# Patient Record
Sex: Female | Born: 1935 | Race: White | Hispanic: No | Marital: Married
Health system: Midwestern US, Academic
[De-identification: ages and names within clinical notes are randomized; demographics above are authoritative.]

---

## 2017-01-20 LAB — COMPREHENSIVE METABOLIC PANEL
Lab: 0.7
Lab: 13
Lab: 25
Lab: 25
Lab: 4
Lab: 6.9
Lab: 80

## 2017-01-20 LAB — LIPID PROFILE
Lab: 17 — ABNORMAL HIGH (ref 0.57–1.11)
Lab: 3
Lab: 92

## 2017-01-20 LAB — THYROID STIMULATING HORMONE-TSH: Lab: 8.1 — ABNORMAL HIGH (ref 0.35–4.94)

## 2017-01-20 LAB — FREE T4 (FREE THYROXINE) ONLY: Lab: 1

## 2017-03-15 ENCOUNTER — Encounter: Admit: 2017-03-15 | Discharge: 2017-03-15 | Payer: MEDICARE

## 2017-03-29 ENCOUNTER — Ambulatory Visit: Admit: 2017-03-29 | Discharge: 2017-03-30 | Payer: MEDICARE

## 2017-03-29 ENCOUNTER — Encounter: Admit: 2017-03-29 | Discharge: 2017-03-29 | Payer: MEDICARE

## 2017-03-29 DIAGNOSIS — E039 Hypothyroidism, unspecified: ICD-10-CM

## 2017-03-29 DIAGNOSIS — N183 Chronic kidney disease, stage 3 (moderate): ICD-10-CM

## 2017-03-29 DIAGNOSIS — I25110 Atherosclerotic heart disease of native coronary artery with unstable angina pectoris: ICD-10-CM

## 2017-03-29 DIAGNOSIS — N289 Disorder of kidney and ureter, unspecified: ICD-10-CM

## 2017-03-29 DIAGNOSIS — Z955 Presence of coronary angioplasty implant and graft: ICD-10-CM

## 2017-03-29 DIAGNOSIS — J449 Chronic obstructive pulmonary disease, unspecified: ICD-10-CM

## 2017-03-29 DIAGNOSIS — E785 Hyperlipidemia, unspecified: ICD-10-CM

## 2017-03-29 DIAGNOSIS — E7800 Pure hypercholesterolemia, unspecified: Principal | ICD-10-CM

## 2017-05-19 ENCOUNTER — Encounter: Admit: 2017-05-19 | Discharge: 2017-05-19 | Payer: MEDICARE

## 2017-05-19 DIAGNOSIS — E039 Hypothyroidism, unspecified: ICD-10-CM

## 2017-05-19 DIAGNOSIS — N183 Chronic kidney disease, stage 3 (moderate): ICD-10-CM

## 2017-05-19 DIAGNOSIS — N289 Disorder of kidney and ureter, unspecified: ICD-10-CM

## 2017-05-19 DIAGNOSIS — E785 Hyperlipidemia, unspecified: Principal | ICD-10-CM

## 2017-05-19 DIAGNOSIS — J449 Chronic obstructive pulmonary disease, unspecified: ICD-10-CM

## 2017-09-07 ENCOUNTER — Encounter: Admit: 2017-09-07 | Discharge: 2017-09-08 | Payer: MEDICARE

## 2017-09-12 ENCOUNTER — Encounter: Admit: 2017-09-12 | Discharge: 2017-09-13 | Payer: MEDICARE

## 2017-09-13 ENCOUNTER — Encounter: Admit: 2017-09-13 | Discharge: 2017-09-13 | Payer: MEDICARE

## 2017-09-13 ENCOUNTER — Ambulatory Visit: Admit: 2017-09-13 | Discharge: 2017-09-14 | Payer: MEDICARE

## 2017-09-13 DIAGNOSIS — R509 Fever, unspecified: Principal | ICD-10-CM

## 2017-09-14 ENCOUNTER — Encounter: Admit: 2017-09-14 | Discharge: 2017-09-15 | Payer: MEDICARE

## 2017-09-14 ENCOUNTER — Encounter: Admit: 2017-09-14 | Discharge: 2017-09-14 | Payer: MEDICARE

## 2017-09-15 ENCOUNTER — Encounter: Admit: 2017-09-15 | Discharge: 2017-09-15 | Payer: MEDICARE

## 2017-09-15 DIAGNOSIS — T847XXD Infection and inflammatory reaction due to other internal orthopedic prosthetic devices, implants and grafts, subsequent encounter: ICD-10-CM

## 2017-09-15 MED ORDER — VANCOMYCIN RANDOM DOSING
1 | INTRAVENOUS | 0 refills | Status: DC
Start: 2017-09-15 — End: 2017-09-17

## 2017-09-15 MED ORDER — LOSARTAN 50 MG PO TAB
50 mg | Freq: Every day | ORAL | 0 refills | Status: DC
Start: 2017-09-15 — End: 2017-09-16
  Administered 2017-09-16: 13:00:00 50 mg via ORAL

## 2017-09-15 MED ORDER — CEFTRIAXONE INJ 2GM IVP
2 g | INTRAVENOUS | 0 refills | Status: DC
Start: 2017-09-15 — End: 2017-09-21
  Administered 2017-09-16 – 2017-09-21 (×7): 2 g via INTRAVENOUS

## 2017-09-15 MED ORDER — VANCOMYCIN PHARMACY TO MANAGE
1 | 0 refills | Status: DC
Start: 2017-09-15 — End: 2017-09-20

## 2017-09-15 MED ORDER — MELATONIN 3 MG PO TAB
3 mg | Freq: Every evening | ORAL | 0 refills | Status: DC | PRN
Start: 2017-09-15 — End: 2017-09-21

## 2017-09-15 MED ORDER — ACETAMINOPHEN 325 MG PO TAB
650 mg | ORAL | 0 refills | Status: DC | PRN
Start: 2017-09-15 — End: 2017-09-21

## 2017-09-15 MED ORDER — NITROGLYCERIN 0.4 MG SL SUBL
.4 mg | SUBLINGUAL | 0 refills | Status: DC | PRN
Start: 2017-09-15 — End: 2017-09-21

## 2017-09-15 MED ORDER — CETIRIZINE 10 MG PO TAB
10 mg | Freq: Every day | ORAL | 0 refills | Status: DC | PRN
Start: 2017-09-15 — End: 2017-09-21

## 2017-09-15 MED ORDER — LEVOTHYROXINE 88 MCG PO TAB
88 ug | Freq: Every day | ORAL | 0 refills | Status: DC
Start: 2017-09-15 — End: 2017-09-21
  Administered 2017-09-16 – 2017-09-21 (×6): 88 ug via ORAL

## 2017-09-15 MED ORDER — CEFTRIAXONE INJ 1GM IVP
1 g | INTRAVENOUS | 0 refills | Status: DC
Start: 2017-09-15 — End: 2017-09-16

## 2017-09-15 MED ORDER — ONDANSETRON HCL 4 MG PO TAB
4 mg | ORAL | 0 refills | Status: DC | PRN
Start: 2017-09-15 — End: 2017-09-21

## 2017-09-15 MED ORDER — POLYETHYLENE GLYCOL 3350 17 GRAM PO PWPK
1 | Freq: Every day | ORAL | 0 refills | Status: DC | PRN
Start: 2017-09-15 — End: 2017-09-21

## 2017-09-15 MED ORDER — DOXYCYCLINE HYCLATE 100 MG PO TAB
100 mg | Freq: Two times a day (BID) | ORAL | 0 refills | Status: DC
Start: 2017-09-15 — End: 2017-09-21
  Administered 2017-09-16 – 2017-09-21 (×12): 100 mg via ORAL

## 2017-09-15 MED ORDER — ATORVASTATIN 40 MG PO TAB
80 mg | Freq: Every day | ORAL | 0 refills | Status: DC
Start: 2017-09-15 — End: 2017-09-21
  Administered 2017-09-16 – 2017-09-21 (×6): 80 mg via ORAL

## 2017-09-15 MED ORDER — PANTOPRAZOLE 20 MG PO TBEC
20 mg | Freq: Every day | ORAL | 0 refills | Status: DC
Start: 2017-09-15 — End: 2017-09-21
  Administered 2017-09-16 – 2017-09-21 (×7): 20 mg via ORAL

## 2017-09-15 MED ORDER — ASPIRIN 81 MG PO TBEC
81 mg | Freq: Every day | ORAL | 0 refills | Status: DC
Start: 2017-09-15 — End: 2017-09-21
  Administered 2017-09-16 – 2017-09-21 (×7): 81 mg via ORAL

## 2017-09-15 MED ORDER — ALBUTEROL SULFATE 90 MCG/ACTUATION IN HFAA
2 | RESPIRATORY_TRACT | 0 refills | Status: DC | PRN
Start: 2017-09-15 — End: 2017-09-21
  Administered 2017-09-17: 10:00:00 2 via RESPIRATORY_TRACT

## 2017-09-16 ENCOUNTER — Inpatient Hospital Stay: Admit: 2017-09-16 | Discharge: 2017-09-16 | Payer: MEDICARE

## 2017-09-16 ENCOUNTER — Encounter: Admit: 2017-09-16 | Discharge: 2017-09-16 | Payer: MEDICARE

## 2017-09-16 LAB — TROPONIN-I

## 2017-09-16 LAB — COMPREHENSIVE METABOLIC PANEL
Lab: 0.5 mg/dL — ABNORMAL LOW (ref 0.3–1.2)
Lab: 1 mg/dL — ABNORMAL HIGH (ref 0.4–1.00)
Lab: 102 MMOL/L — ABNORMAL LOW (ref 98–110)
Lab: 105 mg/dL — ABNORMAL HIGH (ref 70–100)
Lab: 120 U/L — ABNORMAL HIGH (ref 25–110)
Lab: 133 MMOL/L — ABNORMAL LOW (ref 137–147)
Lab: 15 mg/dL (ref 7–25)
Lab: 22 MMOL/L (ref 21–30)
Lab: 26 U/L (ref 7–56)
Lab: 3.3 g/dL — ABNORMAL LOW (ref 3.5–5.0)
Lab: 36 U/L (ref 7–40)
Lab: 51 mL/min — ABNORMAL LOW (ref >60)
Lab: 7.1 g/dL (ref 6.0–8.0)
Lab: 8.8 mg/dL — ABNORMAL HIGH (ref 8.5–10.6)
Lab: 9 K/UL (ref 3–12)
Lab: >60 mL/min (ref >60)
Lab: 132 MMOL/L — ABNORMAL LOW (ref >60)

## 2017-09-16 LAB — C REACTIVE PROTEIN (CRP): Lab: 13 mg/dL — ABNORMAL HIGH (ref <1.0)

## 2017-09-16 LAB — CBC AND DIFF
Lab: 0 10*3/uL (ref 0–0.20)
Lab: 0.2 10*3/uL (ref 0–0.45)
Lab: 6.3 10*3/uL (ref 4.5–11.0)
Lab: 5.5 K/UL — ABNORMAL HIGH (ref 4.5–11.0)

## 2017-09-16 LAB — VANCOMYCIN TIMED LEVEL: Lab: 6.3 ug/mL — ABNORMAL LOW (ref 98–110)

## 2017-09-16 LAB — PROTIME INR (PT): Lab: 1.1 MMOL/L — ABNORMAL LOW (ref 0.8–1.2)

## 2017-09-16 LAB — PROCALCITONIN: Lab: 0.2 ng/mL — ABNORMAL HIGH (ref <0.10)

## 2017-09-16 LAB — SED RATE: Lab: 38 mm/h — ABNORMAL HIGH (ref 0–30)

## 2017-09-16 LAB — MAGNESIUM: Lab: 1.8 mg/dL — ABNORMAL LOW (ref 1.6–2.6)

## 2017-09-16 MED ORDER — LOSARTAN 25 MG PO TAB
12.5 mg | Freq: Every day | ORAL | 0 refills | Status: DC
Start: 2017-09-16 — End: 2017-09-21
  Administered 2017-09-17 – 2017-09-21 (×5): 12.5 mg via ORAL

## 2017-09-16 MED ORDER — SODIUM CHLORIDE 0.9 % IV SOLP
1000 mL | INTRAVENOUS | 0 refills | Status: AC
Start: 2017-09-16 — End: ?
  Administered 2017-09-16: 18:00:00 1000 mL via INTRAVENOUS

## 2017-09-16 MED ORDER — RP DX TC-99M MEDRONATE MCI
25 | Freq: Once | INTRAVENOUS | 0 refills | Status: CP
Start: 2017-09-16 — End: ?
  Administered 2017-09-16: 15:00:00 26 via INTRAVENOUS

## 2017-09-16 MED ORDER — VANCOMYCIN 1G/250ML D5W IVPB (VIAL2BAG)
1 g | INTRAVENOUS | 0 refills | Status: DC
Start: 2017-09-16 — End: 2017-09-20
  Administered 2017-09-17 – 2017-09-20 (×8): 1000 mg via INTRAVENOUS

## 2017-09-17 ENCOUNTER — Encounter: Admit: 2017-09-17 | Discharge: 2017-09-17 | Payer: MEDICARE

## 2017-09-17 LAB — C DIFFICILE BY PCR: Lab: NEGATIVE

## 2017-09-17 LAB — CBC AND DIFF: Lab: 4.7 10*3/uL — ABNORMAL LOW (ref >60)

## 2017-09-17 LAB — IRON + BINDING CAPACITY + %SAT+ FERRITIN: Lab: 36 ug/dL — ABNORMAL LOW (ref >60)

## 2017-09-17 LAB — THYROID STIMULATING HORMONE-TSH: Lab: 5.9 uU/mL — ABNORMAL HIGH (ref 0.35–5.00)

## 2017-09-17 LAB — COMPREHENSIVE METABOLIC PANEL: Lab: 135 MMOL/L — ABNORMAL LOW (ref 137–147)

## 2017-09-17 LAB — MAGNESIUM: Lab: 2 mg/dL — ABNORMAL LOW (ref >60)

## 2017-09-18 ENCOUNTER — Encounter: Admit: 2017-09-18 | Discharge: 2017-09-18 | Payer: MEDICARE

## 2017-09-18 LAB — MAGNESIUM: Lab: 1.9 mg/dL — ABNORMAL LOW (ref >60)

## 2017-09-18 LAB — CBC AND DIFF: Lab: 4.4 K/UL — ABNORMAL LOW (ref 4.5–11.0)

## 2017-09-18 LAB — COMPREHENSIVE METABOLIC PANEL: Lab: 135 MMOL/L — ABNORMAL LOW (ref >60)

## 2017-09-19 DIAGNOSIS — T84620A Infection and inflammatory reaction due to internal fixation device of right femur, initial encounter: Secondary | ICD-10-CM

## 2017-09-19 LAB — CBC AND DIFF
Lab: 11 g/dL — ABNORMAL LOW (ref 12.0–15.0)
Lab: 3.6 M/UL — ABNORMAL LOW (ref 4.0–5.0)
Lab: 4.2 K/UL — ABNORMAL LOW (ref 4.5–11.0)

## 2017-09-19 LAB — COMPREHENSIVE METABOLIC PANEL: Lab: 136 MMOL/L — ABNORMAL LOW (ref 137–147)

## 2017-09-19 LAB — MAGNESIUM: Lab: 1.8 mg/dL — ABNORMAL LOW (ref >60)

## 2017-09-19 MED ORDER — ENOXAPARIN 40 MG/0.4 ML SC SYRG
40 mg | Freq: Every day | SUBCUTANEOUS | 0 refills | Status: DC
Start: 2017-09-19 — End: 2017-09-21

## 2017-09-20 LAB — CBC AND DIFF: Lab: 5.1 K/UL — ABNORMAL LOW (ref 4.5–11.0)

## 2017-09-20 LAB — VANCOMYCIN TROUGH: Lab: 11 ug/mL (ref 10.0–20.0)

## 2017-09-20 LAB — COMPREHENSIVE METABOLIC PANEL: Lab: 135 MMOL/L — ABNORMAL LOW (ref >60)

## 2017-09-20 LAB — GGTP: Lab: 55 U/L (ref 9–64)

## 2017-09-20 LAB — MAGNESIUM: Lab: 1.8 mg/dL — ABNORMAL LOW (ref 1.6–2.6)

## 2017-09-20 MED ORDER — CLOPIDOGREL 75 MG PO TAB
75 mg | Freq: Every day | ORAL | 0 refills | Status: DC
Start: 2017-09-20 — End: 2017-09-21
  Administered 2017-09-20 – 2017-09-21 (×2): 75 mg via ORAL

## 2017-09-21 ENCOUNTER — Encounter: Admit: 2017-09-21 | Discharge: 2017-09-21 | Payer: MEDICARE

## 2017-09-21 ENCOUNTER — Inpatient Hospital Stay: Admit: 2017-09-17 | Discharge: 2017-09-17 | Payer: MEDICARE

## 2017-09-21 ENCOUNTER — Inpatient Hospital Stay: Admit: 2017-09-18 | Discharge: 2017-09-18 | Payer: MEDICARE

## 2017-09-21 ENCOUNTER — Inpatient Hospital Stay
Admit: 2017-09-15 | Discharge: 2017-09-21 | Disposition: A | Discharge status: Home Health | Payer: MEDICARE | Source: Other Acute Inpatient Hospital

## 2017-09-21 DIAGNOSIS — R197 Diarrhea, unspecified: ICD-10-CM

## 2017-09-21 DIAGNOSIS — E039 Hypothyroidism, unspecified: ICD-10-CM

## 2017-09-21 DIAGNOSIS — E785 Hyperlipidemia, unspecified: ICD-10-CM

## 2017-09-21 DIAGNOSIS — I129 Hypertensive chronic kidney disease with stage 1 through stage 4 chronic kidney disease, or unspecified chronic kidney disease: ICD-10-CM

## 2017-09-21 DIAGNOSIS — R5381 Other malaise: ICD-10-CM

## 2017-09-21 DIAGNOSIS — Z9049 Acquired absence of other specified parts of digestive tract: ICD-10-CM

## 2017-09-21 DIAGNOSIS — J449 Chronic obstructive pulmonary disease, unspecified: ICD-10-CM

## 2017-09-21 DIAGNOSIS — N183 Chronic kidney disease, stage 3 (moderate): ICD-10-CM

## 2017-09-21 DIAGNOSIS — R748 Abnormal levels of other serum enzymes: ICD-10-CM

## 2017-09-21 DIAGNOSIS — R509 Fever, unspecified: ICD-10-CM

## 2017-09-21 DIAGNOSIS — K219 Gastro-esophageal reflux disease without esophagitis: ICD-10-CM

## 2017-09-21 DIAGNOSIS — D638 Anemia in other chronic diseases classified elsewhere: ICD-10-CM

## 2017-09-21 DIAGNOSIS — I2510 Atherosclerotic heart disease of native coronary artery without angina pectoris: ICD-10-CM

## 2017-09-21 DIAGNOSIS — T84620A Infection and inflammatory reaction due to internal fixation device of right femur, initial encounter: Principal | ICD-10-CM

## 2017-09-21 DIAGNOSIS — Z9861 Coronary angioplasty status: ICD-10-CM

## 2017-09-21 DIAGNOSIS — M810 Age-related osteoporosis without current pathological fracture: ICD-10-CM

## 2017-09-21 DIAGNOSIS — E871 Hypo-osmolality and hyponatremia: ICD-10-CM

## 2017-09-21 LAB — MAGNESIUM: Lab: 1.7 mg/dL — ABNORMAL LOW (ref >60)

## 2017-09-21 LAB — COMPREHENSIVE METABOLIC PANEL: Lab: 137 MMOL/L — ABNORMAL LOW (ref 137–147)

## 2017-09-21 LAB — CBC AND DIFF: Lab: 5.7 K/UL — ABNORMAL LOW (ref 4.5–11.0)

## 2017-09-21 MED ORDER — DOXYCYCLINE HYCLATE 100 MG PO TAB
100 mg | ORAL_TABLET | Freq: Two times a day (BID) | ORAL | 0 refills | 8.00000 days | Status: AC
Start: 2017-09-21 — End: ?
  Filled 2017-09-21 (×2): qty 28, 14d supply, fill #1

## 2017-09-21 MED ORDER — CEFTRIAXONE INJ 2GM IVP
2 g | INTRAVENOUS | 0 refills | 2.00000 days | Status: AC
Start: 2017-09-21 — End: 2017-09-30

## 2017-09-21 MED ORDER — LOSARTAN 25 MG PO TAB
12.5 mg | ORAL_TABLET | Freq: Every day | ORAL | 3 refills | 30.00000 days | Status: AC
Start: 2017-09-21 — End: ?

## 2017-09-22 ENCOUNTER — Encounter: Admit: 2017-09-22 | Discharge: 2017-09-22 | Payer: MEDICARE

## 2017-09-27 LAB — ALK PHOS TOTAL: Lab: 110

## 2017-09-27 LAB — AST (SGOT): Lab: 20

## 2017-09-27 LAB — BUN: Lab: 15

## 2017-09-27 LAB — PLATELET COUNT: Lab: 201

## 2017-09-27 LAB — CBC: Lab: 7.6

## 2017-09-27 LAB — HEMOGLOBIN: Lab: 11

## 2017-09-27 LAB — POTASSIUM: Lab: 3.8

## 2017-09-27 LAB — C REACTIVE PROTEIN (CRP): Lab: 1.5

## 2017-09-27 LAB — CREATININE: Lab: 1

## 2017-09-27 LAB — ALT (SGPT): Lab: 18

## 2017-09-28 ENCOUNTER — Encounter: Admit: 2017-09-28 | Discharge: 2017-09-28 | Payer: MEDICARE

## 2017-09-30 ENCOUNTER — Encounter: Admit: 2017-09-30 | Discharge: 2017-09-30 | Payer: MEDICARE

## 2017-09-30 DIAGNOSIS — T847XXD Infection and inflammatory reaction due to other internal orthopedic prosthetic devices, implants and grafts, subsequent encounter: Principal | ICD-10-CM

## 2017-10-09 ENCOUNTER — Encounter: Admit: 2017-10-09 | Discharge: 2017-10-09 | Payer: MEDICARE

## 2017-10-09 LAB — URINALYSIS DIPSTICK
Lab: NEGATIVE % (ref 0–5)
Lab: NEGATIVE K/UL — ABNORMAL HIGH (ref 1.8–7.0)

## 2017-10-09 LAB — URINALYSIS, MICROSCOPIC

## 2017-10-09 LAB — SED RATE: Lab: 18 mm/h — ABNORMAL HIGH (ref 0–30)

## 2017-10-09 LAB — POC LACTATE: Lab: 2 MMOL/L (ref 0.5–2.0)

## 2017-10-09 LAB — CBC AND DIFF
Lab: 0 10*3/uL (ref 0–0.20)
Lab: 0.4 10*3/uL (ref 0–0.45)
Lab: 0.7 10*3/uL (ref 0–0.80)
Lab: 0.7 10*3/uL — ABNORMAL LOW (ref 1.0–4.8)
Lab: 9.9 K/UL (ref 4.5–11.0)

## 2017-10-09 LAB — COMPREHENSIVE METABOLIC PANEL: Lab: 136 MMOL/L — ABNORMAL LOW (ref 137–147)

## 2017-10-09 LAB — C REACTIVE PROTEIN (CRP): Lab: 2.9 mg/dL — ABNORMAL HIGH (ref <1.0)

## 2017-10-09 MED ORDER — NITROGLYCERIN 0.4 MG SL SUBL
.4 mg | SUBLINGUAL | 0 refills | Status: DC | PRN
Start: 2017-10-09 — End: 2017-10-13

## 2017-10-09 MED ORDER — ALBUTEROL SULFATE 90 MCG/ACTUATION IN HFAA
2 | RESPIRATORY_TRACT | 0 refills | Status: DC | PRN
Start: 2017-10-09 — End: 2017-10-13

## 2017-10-09 MED ORDER — CLOPIDOGREL 75 MG PO TAB
75 mg | Freq: Every day | ORAL | 0 refills | Status: DC
Start: 2017-10-09 — End: 2017-10-10

## 2017-10-09 MED ORDER — CLOPIDOGREL 75 MG PO TAB
75 mg | Freq: Every day | ORAL | 0 refills | Status: DC
Start: 2017-10-09 — End: 2017-10-13
  Administered 2017-10-10 – 2017-10-13 (×4): 75 mg via ORAL

## 2017-10-09 MED ORDER — PANTOPRAZOLE 20 MG PO TBEC
20 mg | Freq: Every evening | ORAL | 0 refills | Status: DC
Start: 2017-10-09 — End: 2017-10-13
  Administered 2017-10-11 – 2017-10-13 (×4): 20 mg via ORAL

## 2017-10-09 MED ORDER — ATORVASTATIN 40 MG PO TAB
80 mg | Freq: Every evening | ORAL | 0 refills | Status: DC
Start: 2017-10-09 — End: 2017-10-13
  Administered 2017-10-10 – 2017-10-13 (×4): 80 mg via ORAL

## 2017-10-09 MED ORDER — SODIUM CHLORIDE 0.9 % IV SOLP
1000 mL | INTRAVENOUS | 0 refills | Status: CP
Start: 2017-10-09 — End: ?
  Administered 2017-10-09: 1000 mL via INTRAVENOUS

## 2017-10-09 MED ORDER — ASPIRIN 81 MG PO TBEC
81 mg | Freq: Every day | ORAL | 0 refills | Status: DC
Start: 2017-10-09 — End: 2017-10-10

## 2017-10-09 MED ORDER — CETIRIZINE 10 MG PO TAB
10 mg | Freq: Every day | ORAL | 0 refills | Status: DC | PRN
Start: 2017-10-09 — End: 2017-10-13
  Administered 2017-10-11 – 2017-10-13 (×4): 10 mg via ORAL

## 2017-10-09 MED ORDER — LEVOTHYROXINE 88 MCG PO TAB
88 ug | Freq: Every day | ORAL | 0 refills | Status: DC
Start: 2017-10-09 — End: 2017-10-13
  Administered 2017-10-10 – 2017-10-13 (×4): 88 ug via ORAL

## 2017-10-09 MED ORDER — SODIUM CHLORIDE 0.9 % IV SOLP
INTRAVENOUS | 0 refills | Status: DC
Start: 2017-10-09 — End: 2017-10-10
  Administered 2017-10-10 (×2): 1000.000 mL via INTRAVENOUS

## 2017-10-09 MED ORDER — ASPIRIN 81 MG PO TBEC
81 mg | Freq: Every day | ORAL | 0 refills | Status: DC
Start: 2017-10-09 — End: 2017-10-13
  Administered 2017-10-10 – 2017-10-13 (×4): 81 mg via ORAL

## 2017-10-09 MED ORDER — ACETAMINOPHEN 500 MG PO TAB
1000 mg | Freq: Two times a day (BID) | ORAL | 0 refills | Status: DC | PRN
Start: 2017-10-09 — End: 2017-10-13

## 2017-10-10 DIAGNOSIS — R509 Fever, unspecified: Secondary | ICD-10-CM

## 2017-10-10 LAB — LEUKEMIA-LYMPHOMA PANEL BLOOD

## 2017-10-10 LAB — RETICULOCYTE COUNT
Lab: 130 10*3/uL — ABNORMAL HIGH (ref 30–94)
Lab: 2.8 %
Lab: 3.8 % — ABNORMAL HIGH (ref 0.5–2.0)

## 2017-10-10 LAB — CBC AND DIFF
Lab: 3.3 M/UL — ABNORMAL LOW (ref >60)
Lab: 4.4 K/UL — ABNORMAL LOW (ref 4.5–11.0)

## 2017-10-10 LAB — RHEUMATOID FACTOR (RF): Lab: 15 [IU]/mL — ABNORMAL HIGH (ref <14)

## 2017-10-10 LAB — LDH-LACTATE DEHYDROGENASE: Lab: 159 U/L (ref >60)

## 2017-10-10 LAB — VITAMIN B12: Lab: 196 pg/mL (ref 180–914)

## 2017-10-10 LAB — HAPTOGLOBIN: Lab: 177 mg/dL (ref 16–200)

## 2017-10-10 LAB — FOLATE, SERUM: Lab: 12 ng/mL (ref >3.9)

## 2017-10-10 LAB — IMMATURE PLATELET FRACTION: Lab: 4.1 % (ref 1.1–7.1)

## 2017-10-10 LAB — COMPREHENSIVE METABOLIC PANEL: Lab: 136 MMOL/L — ABNORMAL LOW (ref 137–147)

## 2017-10-10 MED ORDER — CYANOCOBALAMIN (VITAMIN B-12) 500 MCG PO TAB
1000 ug | Freq: Every day | ORAL | 0 refills | Status: DC
Start: 2017-10-10 — End: 2017-10-13
  Administered 2017-10-11 – 2017-10-13 (×3): 1000 ug via ORAL

## 2017-10-11 ENCOUNTER — Encounter: Admit: 2017-10-11 | Discharge: 2017-10-11 | Payer: MEDICARE

## 2017-10-11 DIAGNOSIS — R509 Fever, unspecified: ICD-10-CM

## 2017-10-11 LAB — MISC REFERENCE TEST

## 2017-10-11 LAB — ELECTROPHORESIS-SERUM PROTEIN
Lab: 10 % — ABNORMAL LOW (ref 9–17)
Lab: 11 % (ref 5–15)
Lab: 18 % — ABNORMAL LOW (ref 9–21)
Lab: 5.9 g/dL — ABNORMAL LOW (ref 6.0–8.0)
Lab: 52 % (ref 48–68)
Lab: 6.8 % — ABNORMAL HIGH (ref 2–6)

## 2017-10-11 LAB — EBV DNA, PCR QUANT BLOOD

## 2017-10-11 LAB — LDH-LACTATE DEHYDROGENASE: Lab: 215 U/L — ABNORMAL HIGH (ref >60)

## 2017-10-11 LAB — COMPREHENSIVE METABOLIC PANEL: Lab: 139 MMOL/L — ABNORMAL HIGH (ref >60)

## 2017-10-11 LAB — ANTI-NUCLEAR ANTIBODY(ANA): Lab: <80 {titer} (ref <80)

## 2017-10-11 LAB — PERIPHERAL SMEAR

## 2017-10-11 LAB — ANTI SMITH(SM) ANTI RNP AB: Lab: 0.2 AI (ref <1.0)

## 2017-10-11 LAB — CBC AND DIFF: Lab: 3.2 K/UL — ABNORMAL LOW (ref >60)

## 2017-10-11 LAB — ANTI-DNA DOUBLE STRAND: Lab: <10 {titer} (ref <10)

## 2017-10-11 LAB — CCP IGG ANTIBODY

## 2017-10-12 LAB — MALARIA SMEAR

## 2017-10-12 LAB — CBC AND DIFF: Lab: 3.9 K/UL — ABNORMAL LOW (ref 4.5–11.0)

## 2017-10-12 LAB — ROCKY MT SPOTTED FEVER AB IGG&IGM

## 2017-10-12 LAB — MANUAL DIFF
Lab: 17 % — ABNORMAL HIGH (ref 0–5)
Lab: 32 % (ref 24–44)
Lab: 45 % (ref 41–77)
Lab: 6 % (ref 4–12)

## 2017-10-12 LAB — COMPREHENSIVE METABOLIC PANEL: Lab: 137 MMOL/L — ABNORMAL LOW (ref >60)

## 2017-10-12 LAB — EHRLICHIA AB

## 2017-10-12 LAB — CMV QUANT PCR-BLOOD: Lab: <50

## 2017-10-12 LAB — FUNGITELL: Lab: <31 MMOL/L — ABNORMAL HIGH (ref 3.5–5.1)

## 2017-10-12 LAB — HIV 1& 2 AG-AB SCRN W REFLEX HIV 1 PCR QUANT: Lab: NEGATIVE MMOL/L (ref 21–30)

## 2017-10-12 MED ORDER — FENTANYL CITRATE (PF) 50 MCG/ML IJ SOLN
25-100 ug | Freq: Once | INTRAVENOUS | 0 refills | Status: CP | PRN
Start: 2017-10-12 — End: ?

## 2017-10-12 MED ORDER — LORAZEPAM 0.5 MG PO TAB
.5 mg | Freq: Once | ORAL | 0 refills | Status: CP
Start: 2017-10-12 — End: ?
  Administered 2017-10-12: 20:00:00 0.5 mg via ORAL

## 2017-10-13 ENCOUNTER — Encounter: Admit: 2017-10-13 | Discharge: 2017-10-13 | Payer: MEDICARE

## 2017-10-13 ENCOUNTER — Emergency Department: Admit: 2017-10-09 | Discharge: 2017-10-09 | Payer: MEDICARE

## 2017-10-13 ENCOUNTER — Inpatient Hospital Stay: Admit: 2017-10-11 | Discharge: 2017-10-11 | Payer: MEDICARE

## 2017-10-13 ENCOUNTER — Inpatient Hospital Stay: Admit: 2017-10-10 | Discharge: 2017-10-10 | Payer: MEDICARE

## 2017-10-13 ENCOUNTER — Inpatient Hospital Stay: Admit: 2017-10-09 | Discharge: 2017-10-13 | Disposition: A | Discharge status: Home / Self Care | Payer: MEDICARE

## 2017-10-13 DIAGNOSIS — Z8781 Personal history of (healed) traumatic fracture: ICD-10-CM

## 2017-10-13 DIAGNOSIS — I129 Hypertensive chronic kidney disease with stage 1 through stage 4 chronic kidney disease, or unspecified chronic kidney disease: ICD-10-CM

## 2017-10-13 DIAGNOSIS — Z90710 Acquired absence of both cervix and uterus: ICD-10-CM

## 2017-10-13 DIAGNOSIS — E785 Hyperlipidemia, unspecified: ICD-10-CM

## 2017-10-13 DIAGNOSIS — M810 Age-related osteoporosis without current pathological fracture: ICD-10-CM

## 2017-10-13 DIAGNOSIS — K279 Peptic ulcer, site unspecified, unspecified as acute or chronic, without hemorrhage or perforation: ICD-10-CM

## 2017-10-13 DIAGNOSIS — I2510 Atherosclerotic heart disease of native coronary artery without angina pectoris: ICD-10-CM

## 2017-10-13 DIAGNOSIS — Z9861 Coronary angioplasty status: ICD-10-CM

## 2017-10-13 DIAGNOSIS — E538 Deficiency of other specified B group vitamins: ICD-10-CM

## 2017-10-13 DIAGNOSIS — D721 Eosinophilia: ICD-10-CM

## 2017-10-13 DIAGNOSIS — S7291XA Unspecified fracture of right femur, initial encounter for closed fracture: ICD-10-CM

## 2017-10-13 DIAGNOSIS — N289 Disorder of kidney and ureter, unspecified: ICD-10-CM

## 2017-10-13 DIAGNOSIS — N183 Chronic kidney disease, stage 3 (moderate): ICD-10-CM

## 2017-10-13 DIAGNOSIS — E039 Hypothyroidism, unspecified: ICD-10-CM

## 2017-10-13 DIAGNOSIS — D61818 Other pancytopenia: ICD-10-CM

## 2017-10-13 DIAGNOSIS — J449 Chronic obstructive pulmonary disease, unspecified: ICD-10-CM

## 2017-10-13 DIAGNOSIS — M85851 Other specified disorders of bone density and structure, right thigh: ICD-10-CM

## 2017-10-13 DIAGNOSIS — M1990 Unspecified osteoarthritis, unspecified site: ICD-10-CM

## 2017-10-13 DIAGNOSIS — M85852 Other specified disorders of bone density and structure, left thigh: ICD-10-CM

## 2017-10-13 DIAGNOSIS — J679 Hypersensitivity pneumonitis due to unspecified organic dust: Principal | ICD-10-CM

## 2017-10-13 DIAGNOSIS — K219 Gastro-esophageal reflux disease without esophagitis: ICD-10-CM

## 2017-10-13 DIAGNOSIS — N179 Acute kidney failure, unspecified: ICD-10-CM

## 2017-10-13 DIAGNOSIS — I252 Old myocardial infarction: ICD-10-CM

## 2017-10-13 LAB — PROTIME INR (PT): Lab: 0.9 M/UL — ABNORMAL HIGH (ref 0.8–1.2)

## 2017-10-13 LAB — PTT (APTT): Lab: 24 s — ABNORMAL HIGH (ref 24.0–36.5)

## 2017-10-13 LAB — CBC AND DIFF: Lab: 5 K/UL — ABNORMAL LOW (ref 4.5–11.0)

## 2017-10-13 LAB — B MICROTI IGG AND IGM: Lab: <1

## 2017-10-13 LAB — FIBRINOGEN: Lab: 386 mg/dL (ref >60)

## 2017-10-13 LAB — Q FEVER(COXIELLA BURNETII)AB: Lab: NEGATIVE U/L — ABNORMAL HIGH (ref 7–40)

## 2017-10-13 LAB — COMPREHENSIVE METABOLIC PANEL: Lab: 138 MMOL/L — ABNORMAL HIGH (ref >60)

## 2017-10-13 MED ORDER — CYANOCOBALAMIN (VITAMIN B-12) 500 MCG PO TAB
2000 ug | ORAL_TABLET | Freq: Every day | ORAL | 0 refills | 29.00000 days | Status: AC
Start: 2017-10-13 — End: ?

## 2017-10-14 LAB — MISC ARUP TEST: Lab: 300 FL (ref 80–100)

## 2017-10-14 LAB — HISTOPLASMA AG, SERUM

## 2017-10-14 LAB — T SPOT TB (QUANTIFERON TB): Lab: NEGATIVE mg/dL (ref 7–25)

## 2017-10-14 LAB — STRONGYLOIDES AB IGG: Lab: 0.1 mg/dL (ref >60)

## 2017-10-14 LAB — HISTOPLASMA AB
Lab: 1:8 {titer} — ABNORMAL HIGH (ref <=5)
Lab: NEGATIVE 10*3/uL — ABNORMAL HIGH (ref 3.8–10.8)
Lab: NEGATIVE ng/mL — ABNORMAL LOW (ref 80.0–100.0)

## 2017-10-14 LAB — COPPER: Lab: 1.3 MMOL/L — ABNORMAL HIGH (ref 3.5–5.1)

## 2017-10-15 LAB — CULTURE-BLOOD W/SENSITIVITY

## 2017-10-15 LAB — HISTOPLASMA AG-URINE RANDOM

## 2017-10-15 LAB — FRANCISELLA TULARENSIS AB

## 2017-10-18 LAB — CULTURE-BLOOD W/SENSITIVITY

## 2017-11-08 ENCOUNTER — Encounter: Admit: 2017-11-08 | Discharge: 2017-11-08 | Payer: MEDICARE

## 2017-11-08 DIAGNOSIS — M1990 Unspecified osteoarthritis, unspecified site: ICD-10-CM

## 2017-11-08 DIAGNOSIS — N289 Disorder of kidney and ureter, unspecified: ICD-10-CM

## 2017-11-08 DIAGNOSIS — I10 Essential (primary) hypertension: ICD-10-CM

## 2017-11-08 DIAGNOSIS — K319 Disease of stomach and duodenum, unspecified: ICD-10-CM

## 2017-11-08 DIAGNOSIS — E039 Hypothyroidism, unspecified: Secondary | ICD-10-CM

## 2017-11-08 DIAGNOSIS — G809 Cerebral palsy, unspecified: Principal | ICD-10-CM

## 2017-11-08 DIAGNOSIS — S7291XA Unspecified fracture of right femur, initial encounter for closed fracture: ICD-10-CM

## 2017-11-08 DIAGNOSIS — D696 Thrombocytopenia, unspecified: ICD-10-CM

## 2017-11-08 DIAGNOSIS — R509 Fever, unspecified: ICD-10-CM

## 2017-11-08 DIAGNOSIS — N183 Chronic kidney disease, stage 3 (moderate): ICD-10-CM

## 2017-11-08 DIAGNOSIS — J449 Chronic obstructive pulmonary disease, unspecified: ICD-10-CM

## 2017-11-08 DIAGNOSIS — D61818 Other pancytopenia: ICD-10-CM

## 2017-11-08 DIAGNOSIS — I519 Heart disease, unspecified: ICD-10-CM

## 2017-11-08 DIAGNOSIS — M810 Age-related osteoporosis without current pathological fracture: ICD-10-CM

## 2017-11-08 DIAGNOSIS — E785 Hyperlipidemia, unspecified: Principal | ICD-10-CM

## 2017-11-08 LAB — CBC AND DIFF
Lab: 0 10*3/uL (ref 0–0.20)
Lab: 0.2 10*3/uL — ABNORMAL HIGH (ref 0–0.45)
Lab: 0.4 10*3/uL (ref 0–0.80)
Lab: 1.6 10*3/uL — ABNORMAL HIGH (ref 1.0–4.8)
Lab: 12 g/dL (ref 12.0–15.0)
Lab: 139 10*3/uL — ABNORMAL LOW (ref 150–400)
Lab: 15 % — ABNORMAL HIGH (ref 11–15)
Lab: 2.2 10*3/uL (ref 1.8–7.0)
Lab: 29 pg — ABNORMAL LOW (ref 26–34)
Lab: 32 g/dL — ABNORMAL LOW (ref 32.0–36.0)
Lab: 36 % — ABNORMAL HIGH (ref 24–44)
Lab: 39 % (ref 36–45)
Lab: 4.3 M/UL (ref 4.0–5.0)
Lab: 4.5 10*3/uL (ref 4.5–11.0)
Lab: 49 % (ref 41–77)
Lab: 5 % — ABNORMAL LOW (ref 0–5)
Lab: 7.9 FL (ref 7–11)
Lab: 88 FL (ref 80–100)
Lab: 9 % (ref 4–12)

## 2017-11-14 LAB — CULTURE-FUNGAL,BLOOD W/SENSITIVITY

## 2017-11-16 ENCOUNTER — Encounter: Admit: 2017-11-16 | Discharge: 2017-11-16 | Payer: MEDICARE

## 2017-11-25 ENCOUNTER — Encounter: Admit: 2017-11-25 | Discharge: 2017-11-25 | Payer: MEDICARE

## 2017-11-28 LAB — CULTURE-BLOOD TB (AFB)

## 2017-11-28 LAB — CULTURE-TB (AFB)

## 2018-01-05 LAB — CULTURE-TB (AFB)

## 2018-01-06 LAB — CULTURE-TB (AFB)

## 2018-01-09 ENCOUNTER — Encounter: Admit: 2018-01-09 | Discharge: 2018-01-09 | Payer: MEDICARE

## 2019-07-30 IMAGING — CT Head^1_SINUS (Adult)
1 series · 16 of 30 positions shown, 20 images · non-contrast
Comparison: none

[Series 4: sinus 3.0 soft tissue · axial · 0.35mm/px · z∈[+70,+166]mm · 16 of 36 slices shown, 20 images]
[im 2/36  brain]
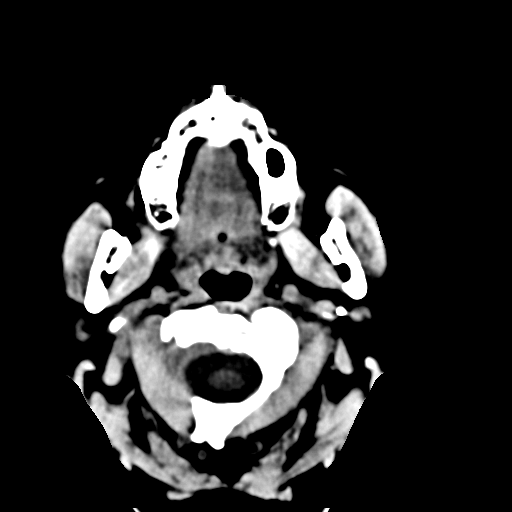
[im 2/36  bone]
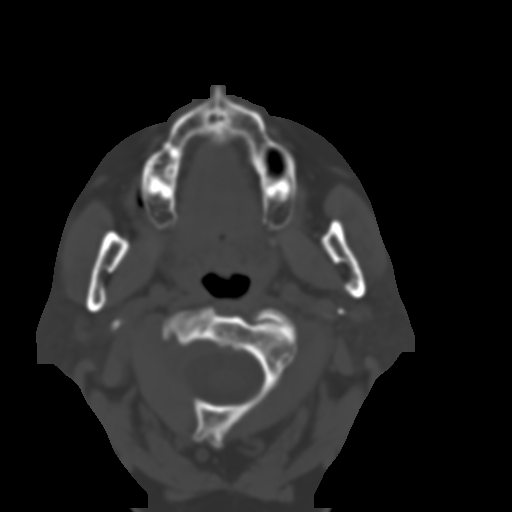
[im 4/36  bone]
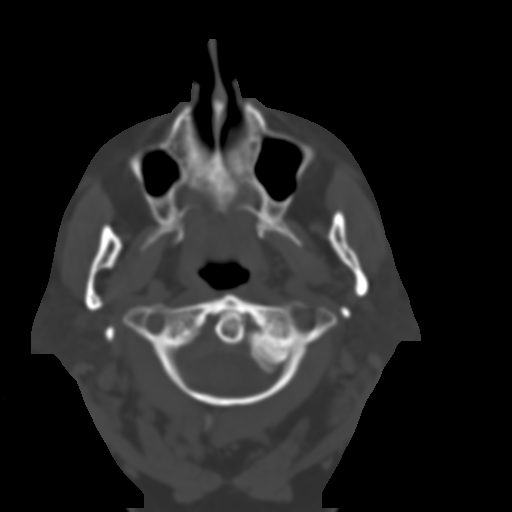
[im 7/36  bone]
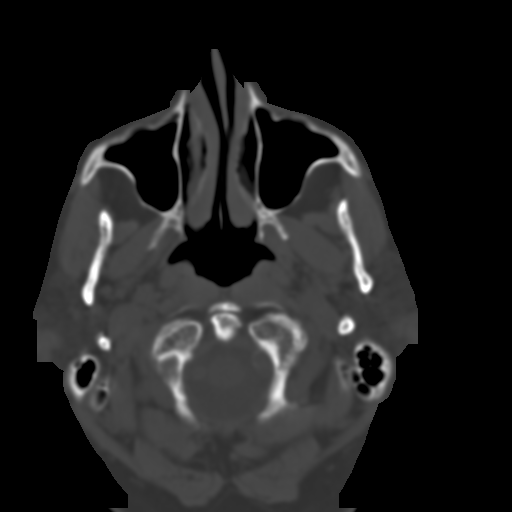
[im 9/36  bone]
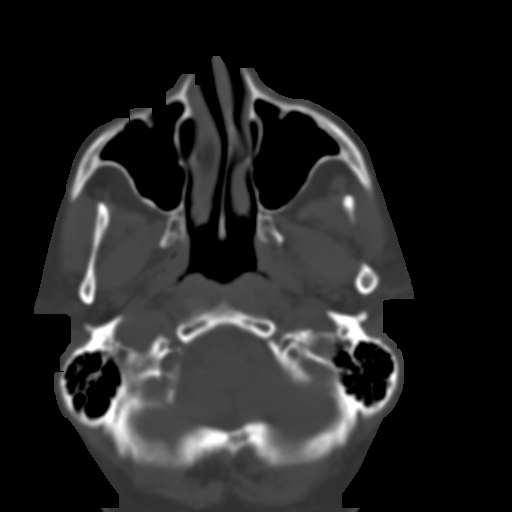
[im 10/36  brain]
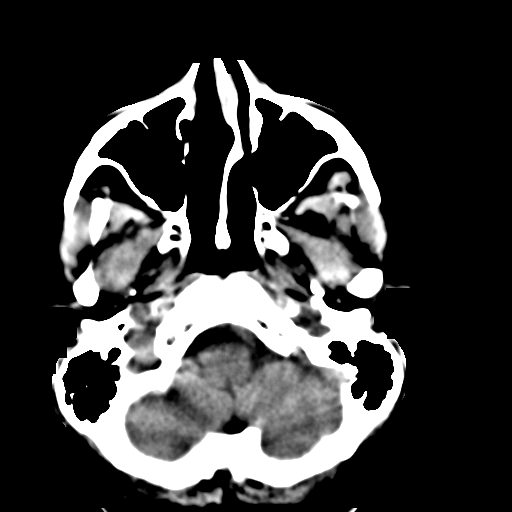
[im 10/36  bone]
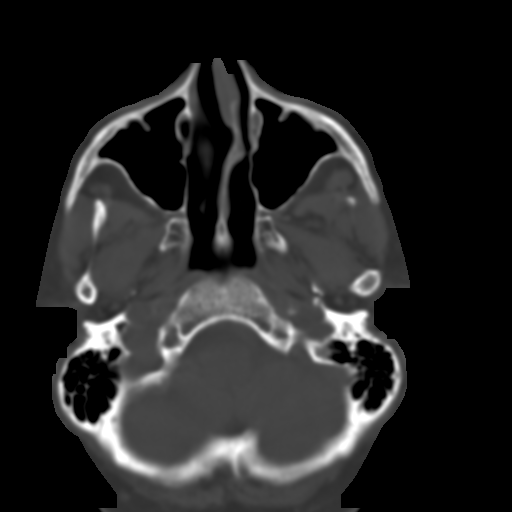
[im 13/36  bone]
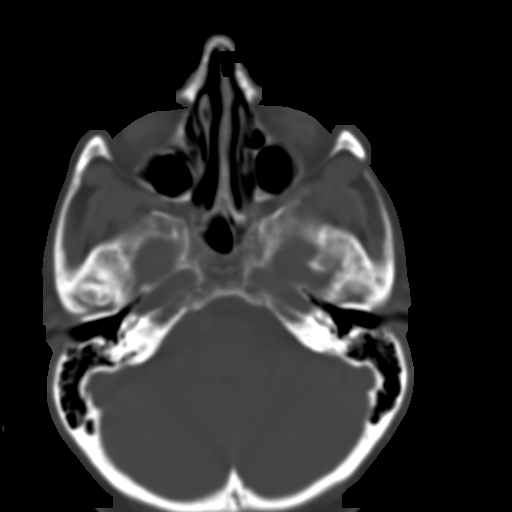
[im 15/36  bone]
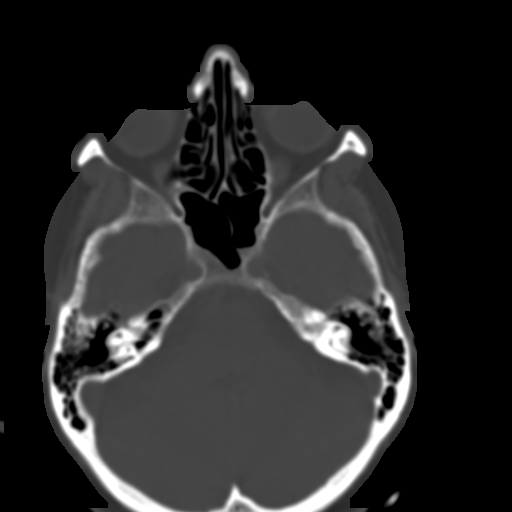
[im 17/36  bone]
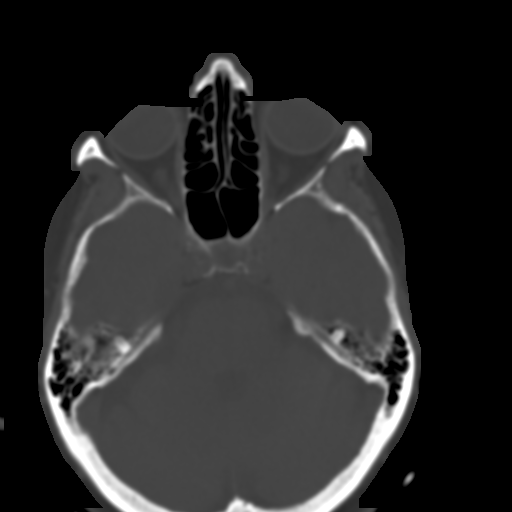
[im 19/36  brain]
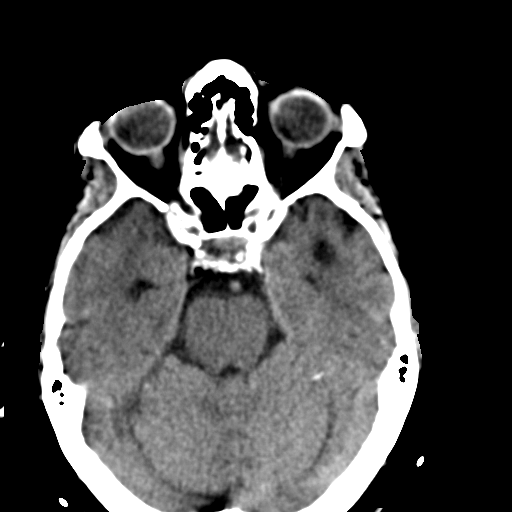
[im 19/36  bone]
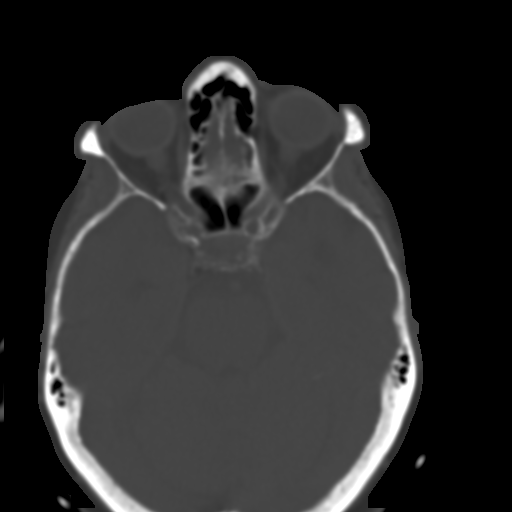
[im 21/36  bone]
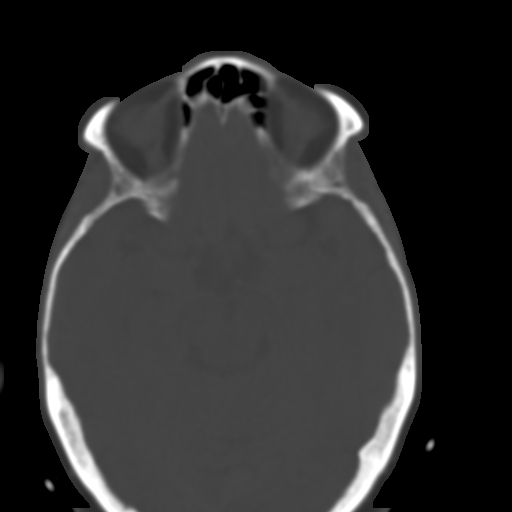
[im 23/36  bone]
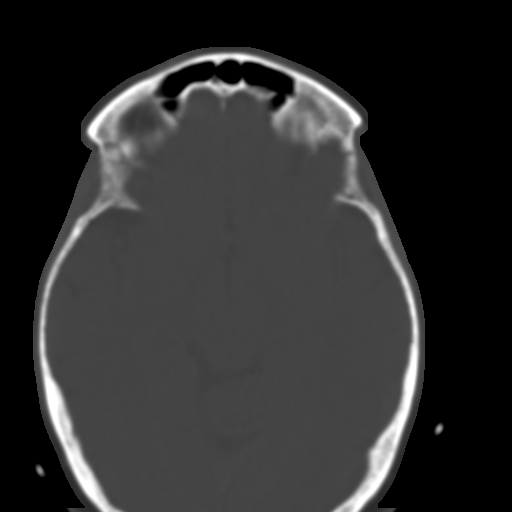
[im 26/36  bone]
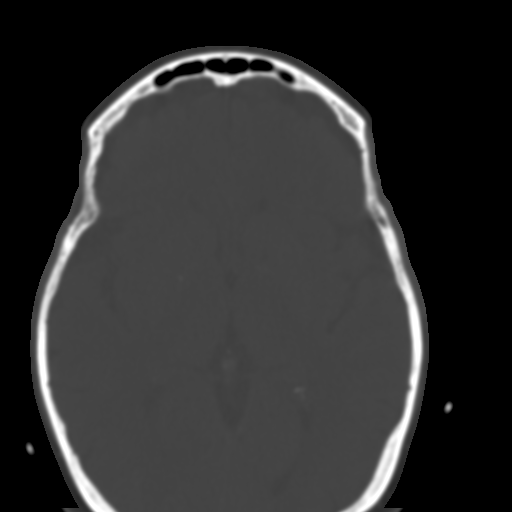
[im 27/36  brain]
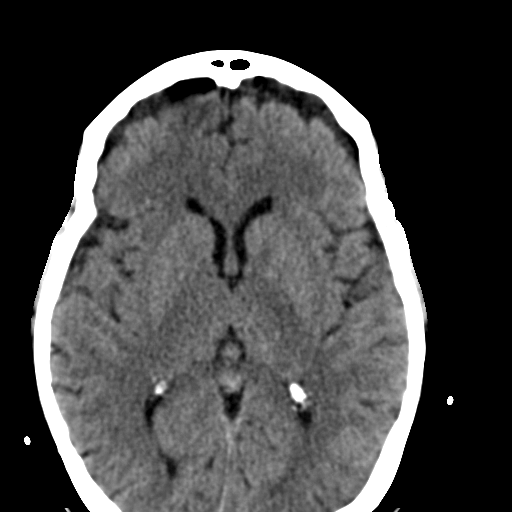
[im 27/36  bone]
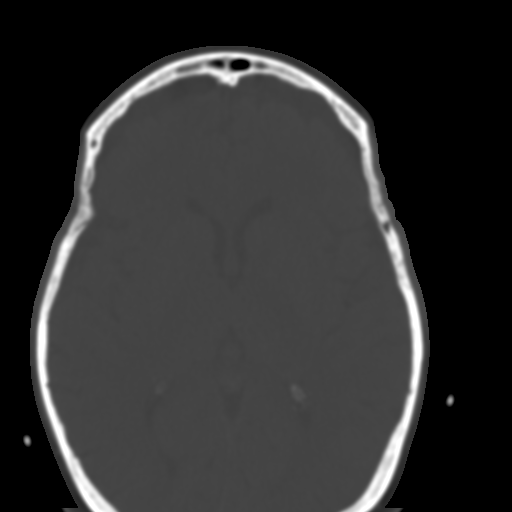
[im 29/36  bone]
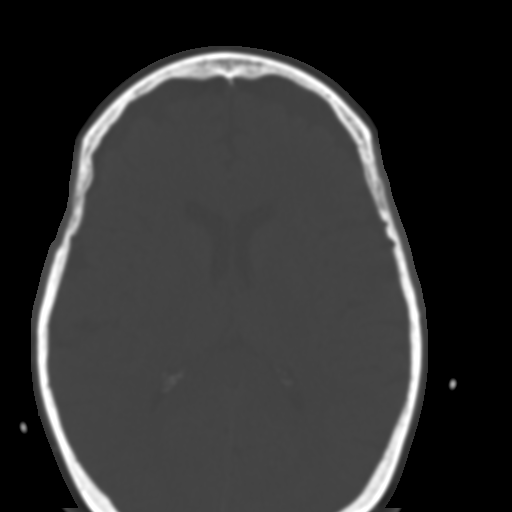
[im 32/36  bone]
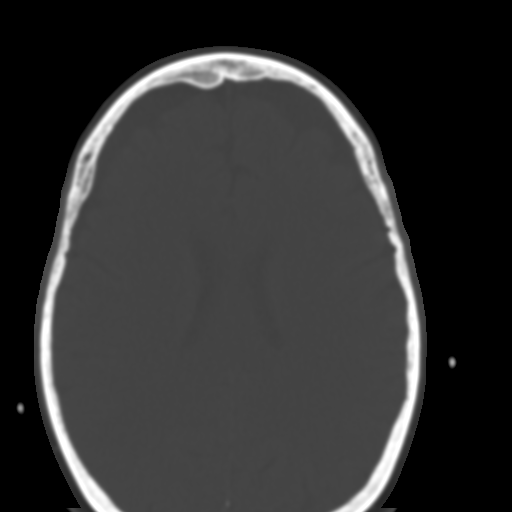
[im 34/36  bone]
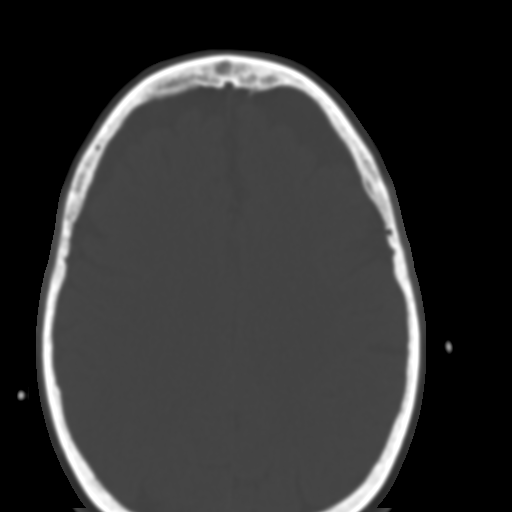

[16 of 30 positions shown; findings below may reference images not displayed]

EXAM
CT paranasal sinuses without contrast.

INDICATION
fever of unknown origin
FEVER OF UNKNOWN ORIGIN. AW

FINDINGS
CT of the paranasal sinuses was performed without contrast.
All CT scans at this facility use dose modulation, iterative reconstruction, and/or weight based
dosing when appropriate to reduce radiation dose to as low as reasonably achievable.
The frontal, ethmoid, maxillary and sphenoid sinuses are clear. There is patency of the ostiomeatal
complexes. There are no air-fluid levels. There is no bone destruction.
There is slight deviation of the nasal septum to the left.

IMPRESSION
There is no acute CT abnormality of the paranasal sinuses. There is no evidence of sinusitis. There
is mild deviation of the nasal septum to the left.

Tech Notes:

FEVER OF UNKNOWN ORIGIN. AW

## 2019-07-31 IMAGING — CR LOW_EXM
4 series · 4 of 4 positions shown · non-contrast
Comparison: none

EXAM

Right femur.
INDICATION
Positive white blood cell study.

[femur ap proximal]
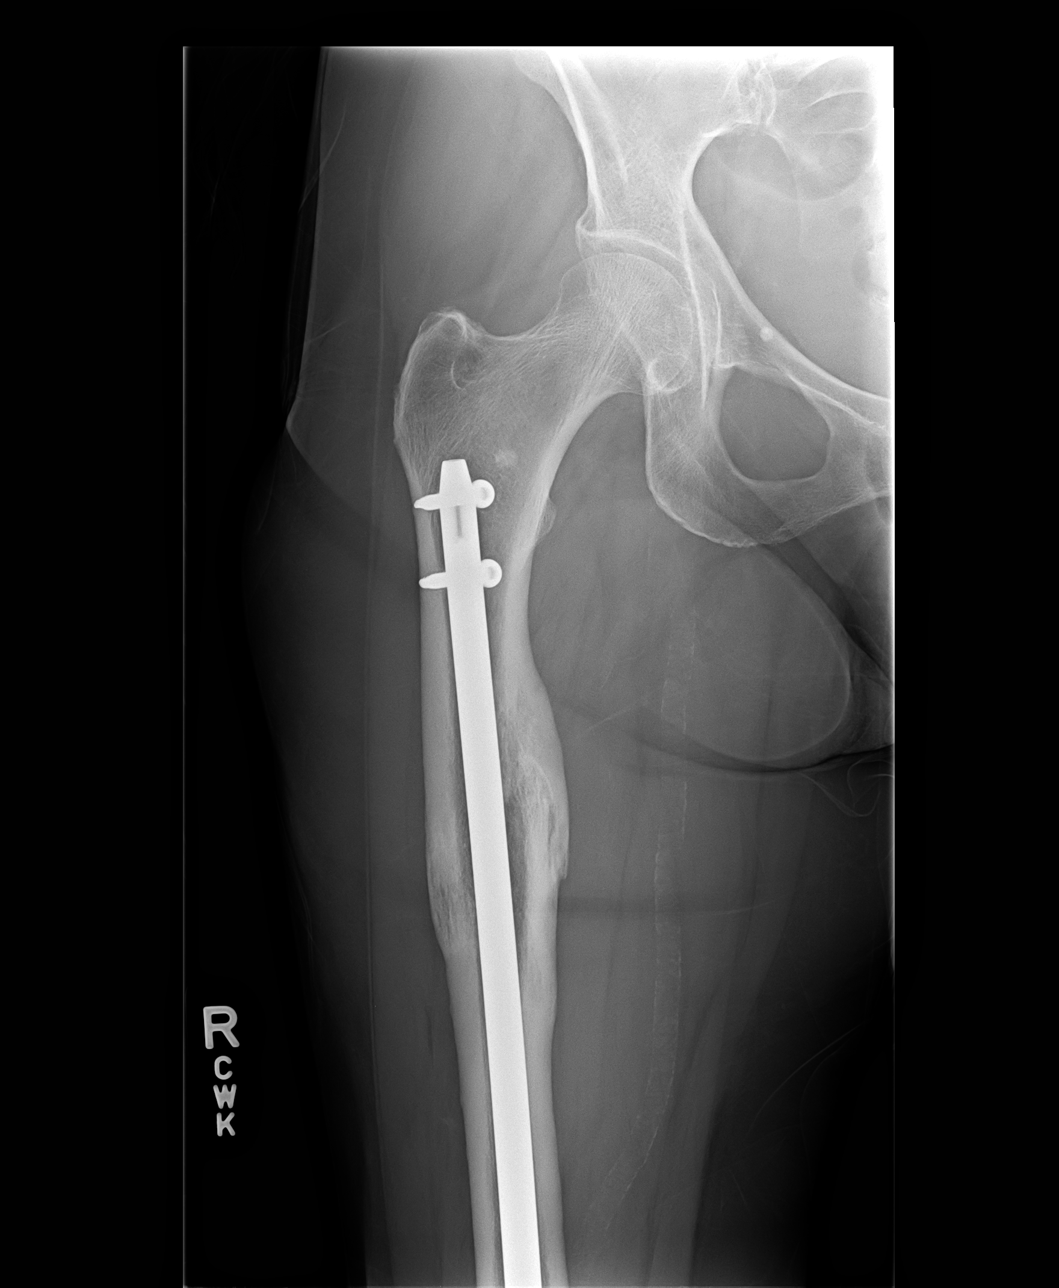

[femur ap distal]
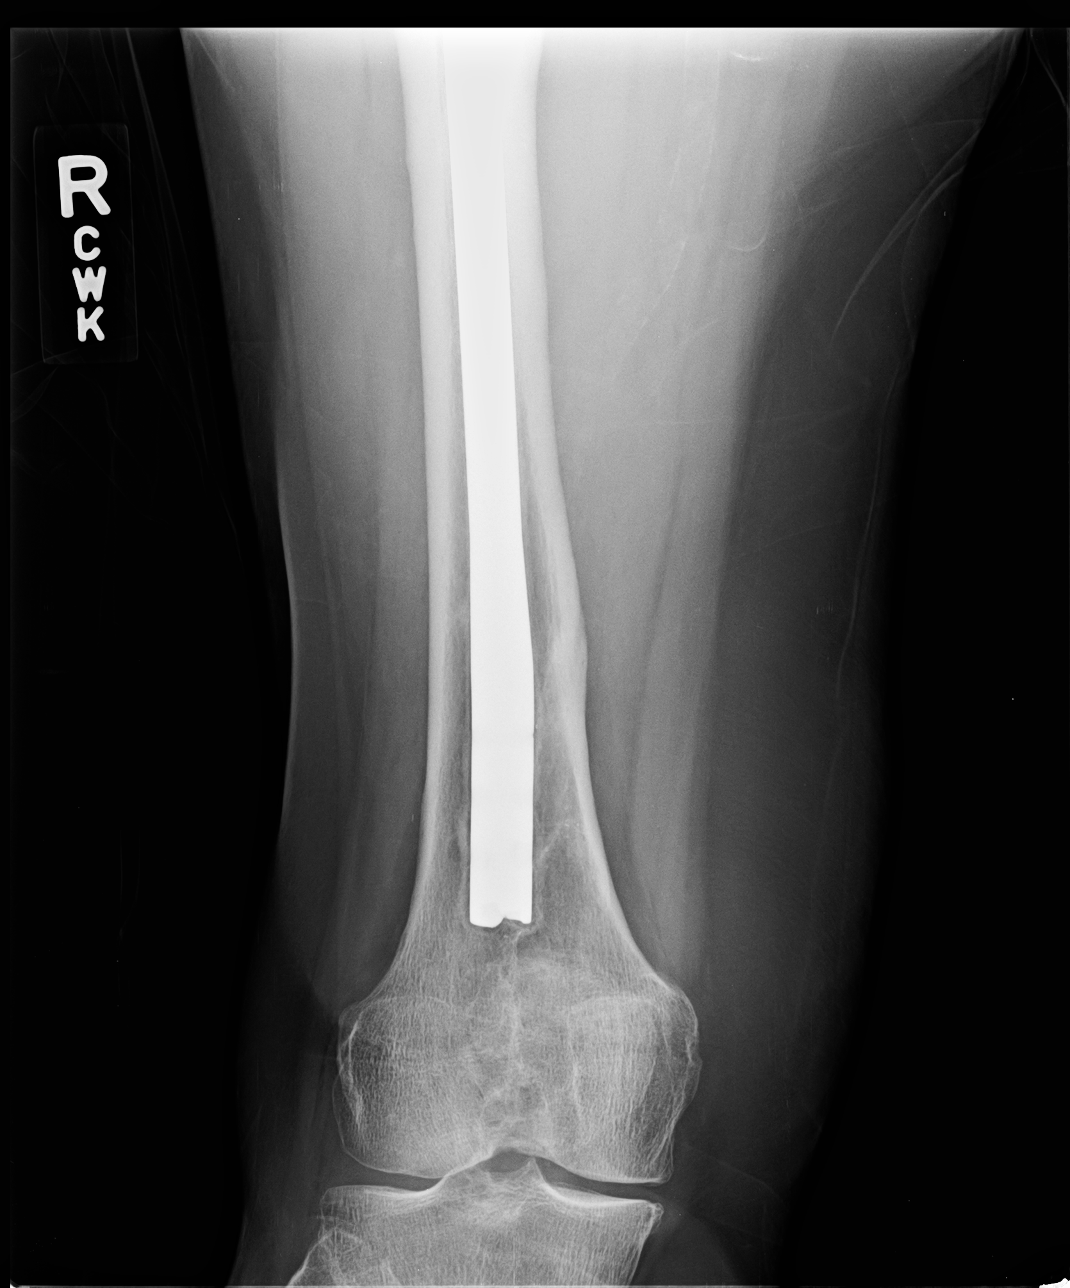

[femur lat proximal]
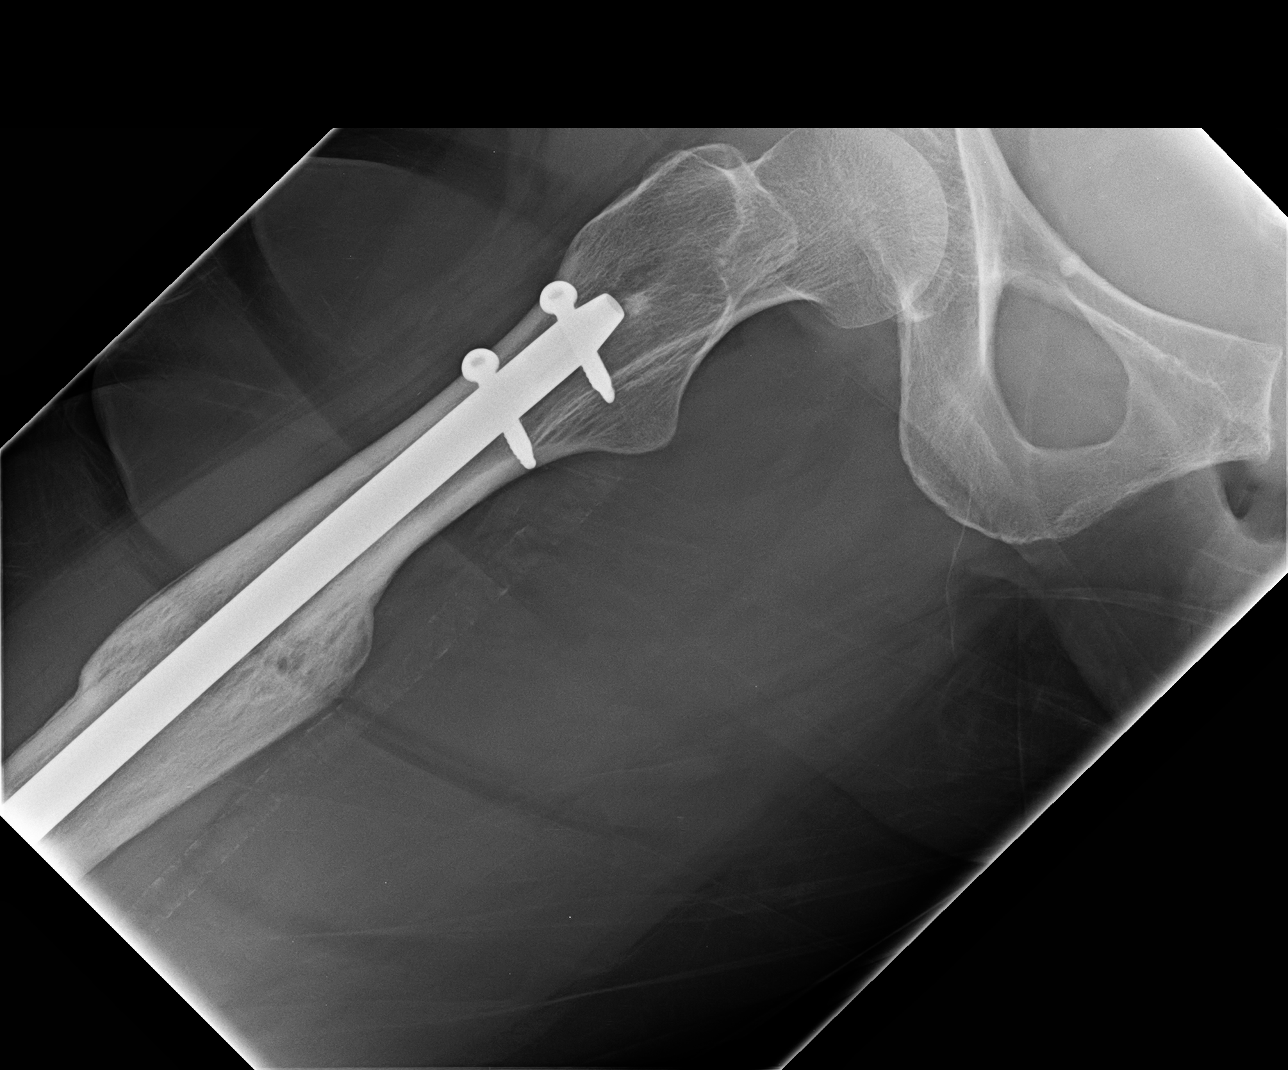

[femur lat distal]
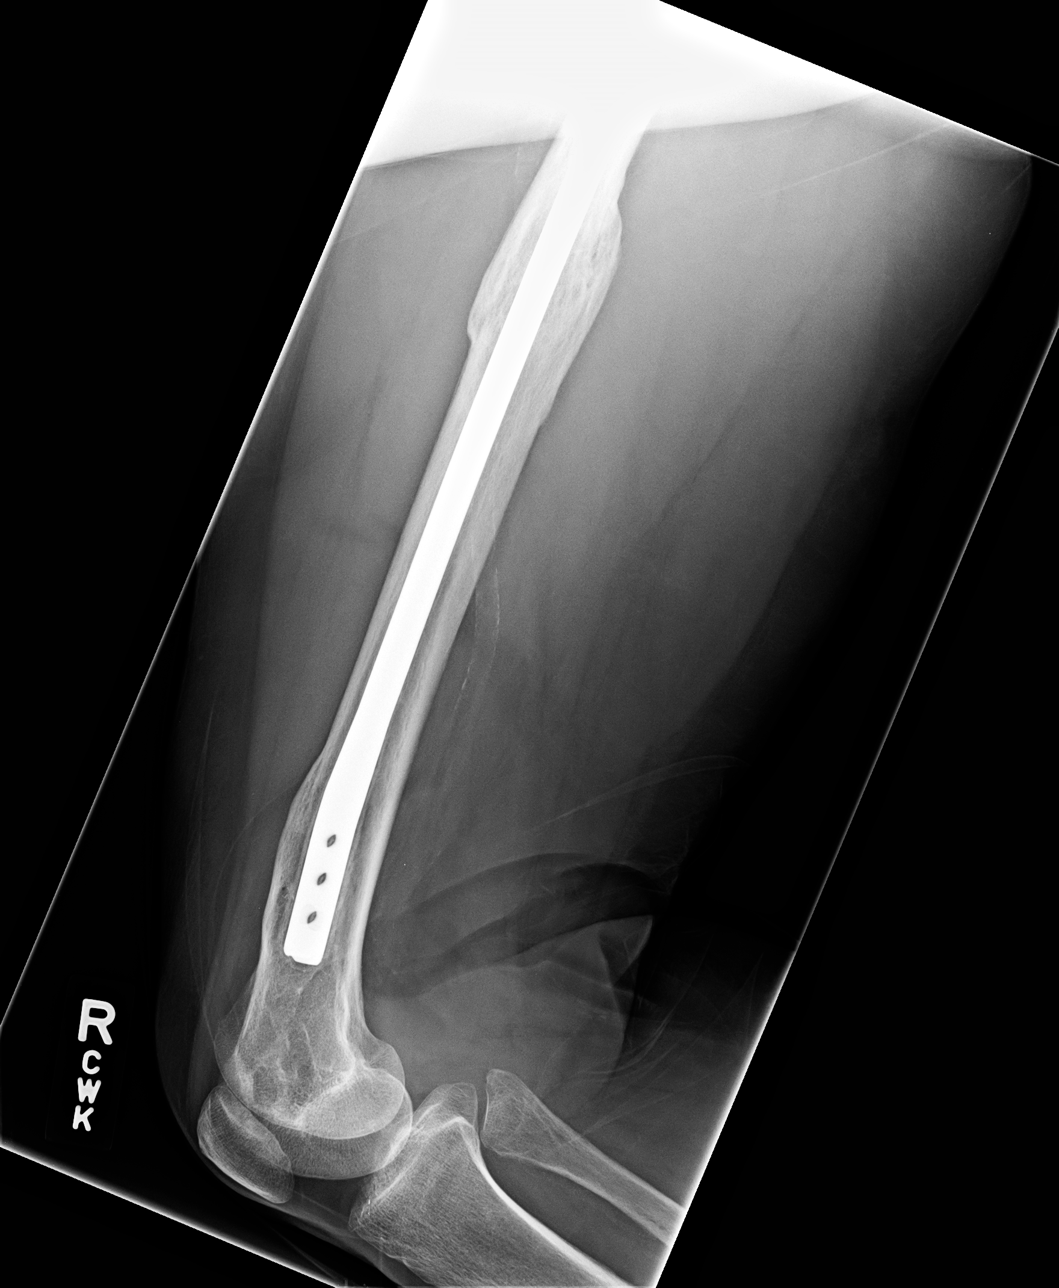

[4 of 4 positions shown; findings below may reference images not displayed]

FINDINGS: Post traumatic and surgical changes are identified of the right femur. There is an intramedullary
rod across a healed upper shaft fracture. The healed fracture site corresponds to the abnormal white
blood cell scan activity. The radiographic findings may be related to osteomyelitis but are not
diagnostic.

Tech Notes:

NUC MED TAGGED WBC POSITIVE FOR INFECTION. RECCOMMEND F/U XRAY. CK

## 2020-02-07 ENCOUNTER — Inpatient Hospital Stay: Admit: 2020-02-07 | Discharge: 2020-02-07 | Payer: MEDICARE

## 2020-02-07 ENCOUNTER — Encounter: Admit: 2020-02-07 | Discharge: 2020-02-07 | Payer: MEDICARE

## 2020-02-07 ENCOUNTER — Emergency Department: Admit: 2020-02-07 | Discharge: 2020-02-07 | Payer: MEDICARE

## 2020-02-07 ENCOUNTER — Inpatient Hospital Stay: Admit: 2020-02-07 | Payer: MEDICARE

## 2020-02-07 DIAGNOSIS — R0902 Hypoxemia: Secondary | ICD-10-CM

## 2020-02-07 DIAGNOSIS — T1490XA Injury, unspecified, initial encounter: Secondary | ICD-10-CM

## 2020-02-07 DIAGNOSIS — S270XXA Traumatic pneumothorax, initial encounter: Secondary | ICD-10-CM

## 2020-02-07 LAB — BLOOD GASES, ARTERIAL
Lab: 22 MMOL/L (ref 21–28)
Lab: 35 mmHg (ref 35–45)

## 2020-02-07 LAB — CBC
Lab: 14 K/UL — ABNORMAL HIGH (ref 4.5–11.0)
Lab: 29 pg (ref 26–34)
Lab: 30 % — ABNORMAL LOW (ref 36–45)
Lab: 14 K/UL — ABNORMAL HIGH (ref 4.5–11.0)
Lab: 15 % — ABNORMAL HIGH (ref 11–15)
Lab: 9.2 g/dL — ABNORMAL LOW (ref 12.0–15.0)
Lab: 15 % — ABNORMAL HIGH (ref 11–15)
Lab: 7.6 FL — ABNORMAL LOW (ref 7–11)
Lab: 9.3 K/UL (ref 4.5–11.0)
Lab: 15 % — ABNORMAL HIGH (ref 11–15)
Lab: 2.8 M/UL — ABNORMAL LOW (ref 4.0–5.0)
Lab: 24 % — ABNORMAL LOW (ref 36–45)
Lab: 31 pg (ref 26–34)
Lab: 35 g/dL (ref 32.0–36.0)
Lab: 8 FL (ref 7–11)
Lab: 8 K/UL (ref 4.5–11.0)
Lab: 8.7 g/dL — ABNORMAL LOW (ref 12.0–15.0)
Lab: 83 K/UL — ABNORMAL LOW (ref 150–400)
Lab: 88 FL (ref 80–100)

## 2020-02-07 LAB — COMPREHENSIVE METABOLIC PANEL
Lab: 0.3 mg/dL (ref 0.3–1.2)
Lab: 1.2 mg/dL — ABNORMAL HIGH (ref 0.4–1.00)
Lab: 12 (ref 3–12)
Lab: 136 MMOL/L — ABNORMAL LOW (ref 137–147)
Lab: 18 MMOL/L — ABNORMAL LOW (ref 21–30)
Lab: 236 mg/dL — ABNORMAL HIGH (ref 70–100)
Lab: 25 mg/dL (ref 7–25)
Lab: 3 g/dL — ABNORMAL LOW (ref 3.5–5.0)
Lab: 31 U/L (ref 7–56)
Lab: 4.6 g/dL — ABNORMAL LOW (ref 6.0–8.0)
Lab: 41 U/L — ABNORMAL HIGH (ref 7–40)
Lab: 42 mL/min — ABNORMAL LOW (ref >60)
Lab: 68 U/L (ref 25–110)

## 2020-02-07 LAB — LACTIC ACID(LACTATE)
Lab: 4.6 MMOL/L — ABNORMAL HIGH (ref 0.5–2.0)
Lab: 2.3 MMOL/L — ABNORMAL HIGH (ref 0.5–2.0)

## 2020-02-07 LAB — BASIC METABOLIC PANEL
Lab: 1.2 mg/dL — ABNORMAL HIGH (ref 0.4–1.00)
Lab: 137 MMOL/L — ABNORMAL LOW (ref 137–147)
Lab: 149 mg/dL — ABNORMAL HIGH (ref 70–100)
Lab: 25 mg/dL — ABNORMAL HIGH (ref 7–25)
Lab: 45 mL/min — ABNORMAL LOW (ref >60)
Lab: 8.6 mg/dL (ref 8.5–10.6)
Lab: 1.1 mg/dL — ABNORMAL HIGH (ref 0.4–1.00)
Lab: 107 MMOL/L — ABNORMAL LOW (ref 98–110)
Lab: 135 MMOL/L — ABNORMAL LOW (ref 137–147)
Lab: 154 mg/dL — ABNORMAL HIGH (ref 70–100)
Lab: 21 MMOL/L (ref 21–30)
Lab: 4.9 MMOL/L — ABNORMAL LOW (ref 3.5–5.1)
Lab: 47 mL/min — ABNORMAL LOW (ref >60)
Lab: 7 pg (ref 3–12)

## 2020-02-07 LAB — LACTIC ACID (BG - RAPID LACTATE): Lab: 4.2 MMOL/L — ABNORMAL HIGH (ref 0.5–2.0)

## 2020-02-07 LAB — POC GLUCOSE: Lab: 212 mg/dL — ABNORMAL HIGH (ref 70–100)

## 2020-02-07 LAB — PHOSPHORUS: Lab: 4 mg/dL — ABNORMAL LOW (ref 2.0–4.5)

## 2020-02-07 LAB — ALCOHOL LEVEL: Lab: <10 mg/dL (ref 80–100)

## 2020-02-07 MED ORDER — ACETAMINOPHEN 1,000 MG/100 ML (10 MG/ML) IV SOLN
1000 mg | Freq: Once | INTRAVENOUS | 0 refills | Status: CP
Start: 2020-02-07 — End: ?
  Administered 2020-02-07: 20:00:00 1000 mg via INTRAVENOUS

## 2020-02-07 MED ORDER — LACTATED RINGERS IV SOLP
500 mL | INTRAVENOUS | 0 refills | Status: CP
Start: 2020-02-07 — End: ?
  Administered 2020-02-07: 18:00:00 500 mL via INTRAVENOUS

## 2020-02-07 MED ORDER — OXYCODONE 5 MG PO TAB
5-10 mg | ORAL | 0 refills | Status: AC | PRN
Start: 2020-02-07 — End: ?
  Administered 2020-02-08 – 2020-02-09 (×6): 5 mg via ORAL
  Administered 2020-02-09 (×4): 10 mg via ORAL

## 2020-02-07 MED ORDER — ONDANSETRON HCL (PF) 4 MG/2 ML IJ SOLN
4 mg | INTRAVENOUS | 0 refills | Status: AC | PRN
Start: 2020-02-07 — End: ?
  Administered 2020-02-07 – 2020-02-11 (×8): 4 mg via INTRAVENOUS

## 2020-02-07 MED ORDER — FENTANYL CITRATE (PF) 50 MCG/ML IJ SOLN
25-50 ug | INTRAVENOUS | 0 refills | PRN
Start: 2020-02-07 — End: ?

## 2020-02-07 MED ORDER — SODIUM CHLORIDE 0.9 % IJ SOLN
50 mL | Freq: Once | INTRAVENOUS | 0 refills | Status: CP
Start: 2020-02-07 — End: ?
  Administered 2020-02-07: 16:00:00 50 mL via INTRAVENOUS

## 2020-02-07 MED ORDER — PROMETHAZINE 25 MG/ML IJ SOLN
6.25 mg | INTRAVENOUS | 0 refills | PRN
Start: 2020-02-07 — End: ?

## 2020-02-07 MED ORDER — DEXMEDETOMIDINE IN 0.9 % NACL 20 MCG/5 ML (4 MCG/ML) IV SYRG
INTRAVENOUS | 0 refills | Status: DC
Start: 2020-02-07 — End: 2020-02-07
  Administered 2020-02-07 (×4): 4 ug via INTRAVENOUS
  Administered 2020-02-07: 22:00:00 8 ug via INTRAVENOUS
  Administered 2020-02-07: 22:00:00 4 ug via INTRAVENOUS

## 2020-02-07 MED ORDER — FENTANYL CITRATE (PF) 50 MCG/ML IJ SOLN
50 ug | INTRAVENOUS | 0 refills | PRN
Start: 2020-02-07 — End: ?

## 2020-02-07 MED ORDER — IOHEXOL 350 MG IODINE/ML IV SOLN
80 mL | Freq: Once | INTRAVENOUS | 0 refills | Status: CP
Start: 2020-02-07 — End: ?
  Administered 2020-02-07: 16:00:00 80 mL via INTRAVENOUS

## 2020-02-07 MED ORDER — IOHEXOL 300 MG IODINE/ML IV SOLN
30 mL | Freq: Once | ORAL | 0 refills | Status: CP
Start: 2020-02-07 — End: ?
  Administered 2020-02-08: 03:00:00 30 mL via ORAL

## 2020-02-07 MED ORDER — KETAMINE 10 MG/ML IJ SOLN
INTRAVENOUS | 0 refills | Status: DC
Start: 2020-02-07 — End: 2020-02-07
  Administered 2020-02-07 (×2): 10 mg via INTRAVENOUS

## 2020-02-07 MED ORDER — IOHEXOL 300 MG IODINE/ML IV SOLN
80 mL | Freq: Once | INTRA_ARTERIAL | 0 refills | Status: CP
Start: 2020-02-07 — End: ?
  Administered 2020-02-07: 23:00:00 80 mL via INTRA_ARTERIAL

## 2020-02-07 MED ORDER — FENTANYL CITRATE (PF) 50 MCG/ML IJ SOLN
100 ug | Freq: Once | INTRAVENOUS | 0 refills | Status: CP
Start: 2020-02-07 — End: ?
  Administered 2020-02-07: 18:00:00 100 ug via INTRAVENOUS

## 2020-02-07 MED ORDER — DEXAMETHASONE SODIUM PHOSPHATE 10 MG/ML IJ SOLN
0 refills | Status: CP
Start: 2020-02-07 — End: ?
  Administered 2020-02-08: 01:00:00 4 mg

## 2020-02-07 MED ORDER — FENTANYL CITRATE (PF) 50 MCG/ML IJ SOLN
25 ug | Freq: Once | INTRAVENOUS | 0 refills | Status: CP
Start: 2020-02-07 — End: ?
  Administered 2020-02-07: 19:00:00 25 ug via INTRAVENOUS

## 2020-02-07 MED ORDER — FENTANYL CITRATE (PF) 50 MCG/ML IJ SOLN
25 ug | INTRAVENOUS | 0 refills | PRN
Start: 2020-02-07 — End: ?

## 2020-02-07 MED ORDER — INSULIN ASPART 100 UNIT/ML SC FLEXPEN
0-12 [IU] | Freq: Before meals | SUBCUTANEOUS | 0 refills | Status: AC
Start: 2020-02-07 — End: ?
  Administered 2020-02-08: 12:00:00 4 [IU] via SUBCUTANEOUS

## 2020-02-07 MED ORDER — DIPHENHYDRAMINE HCL 50 MG/ML IJ SOLN
25 mg | Freq: Once | INTRAVENOUS | 0 refills | PRN
Start: 2020-02-07 — End: ?

## 2020-02-07 MED ORDER — BUPIVACAINE HCL 0.25 % (2.5 MG/ML) IJ SOLN
0 refills | Status: CP
Start: 2020-02-07 — End: ?
  Administered 2020-02-08: 01:00:00 30 mL

## 2020-02-07 MED ORDER — IOHEXOL 350 MG IODINE/ML IV SOLN
60 mL | Freq: Once | INTRAVENOUS | 0 refills | Status: CP
Start: 2020-02-07 — End: ?
  Administered 2020-02-08: 03:00:00 60 mL via INTRAVENOUS

## 2020-02-07 MED ORDER — FENTANYL CITRATE (PF) 50 MCG/ML IJ SOLN
25-50 ug | INTRAVENOUS | 0 refills | Status: DC | PRN
Start: 2020-02-07 — End: 2020-02-07

## 2020-02-07 MED ORDER — ONDANSETRON HCL (PF) 4 MG/2 ML IJ SOLN
4 mg | Freq: Once | INTRAVENOUS | 0 refills | Status: CP
Start: 2020-02-07 — End: ?
  Administered 2020-02-07: 18:00:00 4 mg via INTRAVENOUS

## 2020-02-07 MED ORDER — PROCHLORPERAZINE EDISYLATE 5 MG/ML IJ SOLN
10 mg | INTRAVENOUS | 0 refills | Status: AC | PRN
Start: 2020-02-07 — End: ?
  Administered 2020-02-07 – 2020-02-11 (×4): 10 mg via INTRAVENOUS

## 2020-02-07 MED ORDER — HALOPERIDOL LACTATE 5 MG/ML IJ SOLN
1 mg | Freq: Once | INTRAVENOUS | 0 refills | PRN
Start: 2020-02-07 — End: ?

## 2020-02-07 MED ORDER — ONDANSETRON HCL (PF) 4 MG/2 ML IJ SOLN
INTRAVENOUS | 0 refills | Status: DC
Start: 2020-02-07 — End: 2020-02-07
  Administered 2020-02-07: 23:00:00 4 mg via INTRAVENOUS

## 2020-02-07 MED ORDER — LIDOCAINE (PF) 10 MG/ML (1 %) IJ SOLN
SUBCUTANEOUS | 0 refills | Status: CP
Start: 2020-02-07 — End: ?
  Administered 2020-02-08: 01:00:00 5 mL via SUBCUTANEOUS

## 2020-02-07 MED ORDER — FENTANYL CITRATE (PF) 50 MCG/ML IJ SOLN
25-50 ug | INTRAVENOUS | 0 refills | Status: AC | PRN
Start: 2020-02-07 — End: ?
  Administered 2020-02-08: 15:00:00 25 ug via INTRAVENOUS
  Administered 2020-02-08 – 2020-02-09 (×3): 50 ug via INTRAVENOUS
  Administered 2020-02-09: 15:00:00 25 ug via INTRAVENOUS
  Administered 2020-02-09 (×6): 50 ug via INTRAVENOUS

## 2020-02-07 MED ORDER — PHENYLEPHRINE HCL IN 0.9% NACL 1 MG/10 ML (100 MCG/ML) IV SYRG
INTRAVENOUS | 0 refills | Status: DC
Start: 2020-02-07 — End: 2020-02-07
  Administered 2020-02-07 (×3): 100 ug via INTRAVENOUS

## 2020-02-07 MED ORDER — SODIUM CHLORIDE 0.9 % IV SOLP
INTRAVENOUS | 0 refills | Status: DC
Start: 2020-02-07 — End: 2020-02-07
  Administered 2020-02-07: 21:00:00 via INTRAVENOUS

## 2020-02-07 MED ORDER — SODIUM CHLORIDE 0.9 % IJ SOLN
50 mL | Freq: Once | INTRAVENOUS | 0 refills | Status: CP
Start: 2020-02-07 — End: ?
  Administered 2020-02-08: 03:00:00 50 mL via INTRAVENOUS

## 2020-02-07 MED ORDER — ACETAMINOPHEN 325 MG PO TAB
650 mg | ORAL | 0 refills | Status: AC
Start: 2020-02-07 — End: ?
  Administered 2020-02-08 – 2020-02-10 (×8): 650 mg via ORAL

## 2020-02-07 MED ORDER — METHOCARBAMOL 750 MG PO TAB
750 mg | Freq: Two times a day (BID) | ORAL | 0 refills | Status: AC
Start: 2020-02-07 — End: ?
  Administered 2020-02-08 – 2020-02-11 (×8): 750 mg via ORAL
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
## 2020-02-07 NOTE — Progress Notes
THERAPY  NOTE      Name: Leslie Garcia        MRN: [id number]          DOB: 06/29/1934          Age: 85 y.o.  Admission Date: 02/07/2020             LOS: 0 days      Therapy orders received and patient's chart reviewed. Plan for the patient to go to OR this date for stabilization of left ribs and evacuation of left sided hemothorax. Physical and occupational therapy will hold treatment and follow up postoperatively.     Therapist: Marijo File, PT, DPT  Date: 02/07/2020
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
## 2020-02-07 NOTE — Anesthesia Pre-Procedure Evaluation
Anesthesia Pre-Procedure Evaluation    Name: Leslie Garcia      MRN: [id number]     DOB: 1934/12/27     Age: 85 y.o.     Sex: female   _________________________________________________________________________     Procedure Info:   Procedure Information     Date/Time: 02/07/20 1500    Scheduled providers: Mariann Barter, RN; Laurence Aly, RT(R)(VI),LRT; Cameron Ali Earna Coder, MD; Lonie Peak, MD    Procedure: IR PELVIS ARTERIOGRAM DIAGNOSTIC    Location: Interventional Radiology: Main Campus, [facility]          Physical Assessment  Vital Signs (last filed in past 24 hours):  BP: 107/57 (12/23 1230)  ABP: 149/54 (12/23 1325)  Temp: 35.6 ?C (96 ?F) (12/23 1340)  Pulse: 79 (12/23 1340)  Respirations: 27 PER MINUTE (12/23 1340)  SpO2: 92 % (12/23 1340)  Height: 157.5 cm (62) (12/23 1300)  Weight: 62.7 kg (138 lb 3.7 oz) (12/23 1300)      Patient History   Allergies   Allergen Reactions   ? Codeine NAUSEA AND VOMITING        Current Medications    Medication Directions   acetaminophen (TYLENOL) 500 mg tablet Take 1,000 mg by mouth twice daily as needed for Pain. Max of 4,000 mg of acetaminophen in 24 hours.   albuterol (VENTOLIN HFA, PROAIR HFA) 90 mcg/actuation inhaler Inhale 2 Puffs by mouth every 6 hours as needed for Wheezing.   aspirin EC 81 mg tablet Take 1 tablet by mouth daily.  Patient taking differently: Take 81 mg by mouth at bedtime daily.   atorvastatin (LIPITOR) 80 mg tablet Take 1 tablet by mouth daily.  Patient taking differently: Take 80 mg by mouth at bedtime daily.   cetirizine (ZYRTEC) 10 mg tablet Take 10 mg by mouth daily as needed for Allergy symptoms.   clopiDOGrel (PLAVIX) 75 mg tablet Take 1 tablet by mouth daily. Do not stop taking without cardiologist permission.  Patient taking differently: Take 75 mg by mouth at bedtime daily. Do not stop taking without cardiologist permission.   cyanocobalamin (VITAMIN B-12) 500 mcg tablet Take four tablets by mouth daily.   levothyroxine (SYNTHROID) 88 mcg tablet Take 88 mcg by mouth daily 30 minutes before breakfast.   losartan (COZAAR) 25 mg tablet Take one-half tablet by mouth daily.   nitroglycerin (NITROSTAT) 0.4 mg tablet Place 1 Tab under tongue every 5 minutes as needed for Chest Pain.   other medication 1 Dose. [place] Dream Arthritis Pain Relief Cream: apply topically to skin as needed for pain   pantoprazole DR (PROTONIX) 40 mg tablet Take 40 mg by mouth daily.   PE-Acetam/Doxylamine-PE-Acetam (VICKS DAYQUIL-NYQUIL SINEX) 5-325mg (d)/6.25 -5 mg-325mg (nt) capsule Take 1 capsule by mouth as Needed.         Review of Systems/Medical History      Patient summary reviewed  Pertinent labs reviewed    PONV Screening: Female gender, Non-smoker and Postoperative opioids  No history of anesthetic complications  No family history of anesthetic complications      Airway         No history of head/neck radiation      No prior tracheostomy      Pulmonary           Asthma    COPD      Cardiovascular         Exercise tolerance: >4 METS      Beta Blocker therapy: No  Beta blockers within 24 hours: No      Hypertension,         Past MI, > 6 months      Coronary artery disease      Angina      Hyperlipidemia      2 stents approx 2 years ago, on plavix and asa        GI/Hepatic/Renal         No hx of liver disease     No renal disease      Neuro/Psych       No seizures      No hx TIA      No CVA      Musculoskeletal         Arthritis      Fractures      Endocrine/Other         Hypothyroidism      Anemia   Physical Exam    Airway Findings      Mallampati: II      TM distance: >3 FB      Neck ROM: full      Mouth opening: good      Airway patency: adequate    Dental Findings:       Increased risk for dental injury; pt advised and poor dentition    Cardiovascular Findings:       Rhythm: regular      Rate: normal    Pulmonary Findings:    No wheezes or no stridor.    Abdominal Findings:       Not obese    Neurological Findings:       Alert and oriented x 3 Diagnostic Tests  Hematology:   Lab Results   Component Value Date    HGB 9.2 02/07/2020    HCT 27.4 02/07/2020    PLTCT 166 02/07/2020    WBC 14.6 02/07/2020    NEUT 49 11/08/2017    ANC 2.20 11/08/2017    LYMPH 32 10/12/2017    ALC 1.60 11/08/2017    MONA 9 11/08/2017    AMC 0.40 11/08/2017    EOSA 5 11/08/2017    ABC 0.00 11/08/2017    MCV 87.9 02/07/2020    MCH 29.4 02/07/2020    MCHC 33.4 02/07/2020    MPV 7.7 02/07/2020    RDW 15.7 02/07/2020         General Chemistry:   Lab Results   Component Value Date    NA 136 02/07/2020    K 4.1 02/07/2020    CL 106 02/07/2020    CO2 18 02/07/2020    GAP 12 02/07/2020    BUN 25 02/07/2020    CR 1.26 02/07/2020    GLU 236 02/07/2020    CA 8.4 02/07/2020    ALBUMIN 3.0 02/07/2020    LACTIC 4.6 02/07/2020    OBSCA 1.08 02/07/2020    MG 1.7 02/07/2020    TOTBILI 0.3 02/07/2020    PO4 4.0 02/07/2020      Coagulation:   Lab Results   Component Value Date    PTT 24.5 10/13/2017    INR 0.9 10/13/2017         Anesthesia Plan    ASA score: 3   Plan: MAC  Special equipment/procedures: double lumen tube  Induction method: intravenous      Informed Consent  Anesthetic plan and risks discussed with patient.  Use of blood products discussed with patient  Plan discussed with: anesthesiologist and CRNA.  Comments: (Plan for MAC for IR pelvic arteriogram.  Patient vomited aprox 2 hrs ago with fentanyl.  Currently doing fine resting comfortably in bed s/p compazine for nausea.  She has received 1 U PRBCs and 1 U FFP due to hypotension.  Currently systolic BP in 150's.  I spoke with both the patient and her husband and daughter about plan for anesthesia with backup plan being intubation and GETA.  Risks discussed.  Questions answered.  )

## 2020-02-07 NOTE — Consults
See note earlier.
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
## 2020-02-07 NOTE — Progress Notes
Staff Radiologists reported no evidence for fracture or subluxation on the CT C-spine.  Patient capable to participate in physical exam for C-spine clearance.  Patient denies tenderness to palpation of cervical spine and denies pain with full active Range of Motion.      C-collar removed.

## 2020-02-07 NOTE — Anesthesia Post-Procedure Evaluation
Post-Anesthesia Evaluation    Name: Leslie Garcia      MRN: [id number]     DOB: 06-25-1934     Age: 85 y.o.     Sex: female   __________________________________________________________________________     Procedure Information     Anesthesia Start Date/Time: 02/07/20 1528    Scheduled providers: Mariann Barter, RN; Laurence Aly, RT(R)(VI),LRT; Arlyce Harman, MD    Procedure: IR PELVIS ARTERIOGRAM DIAGNOSTIC    Location: Interventional Radiology: Main Campus, [facility]          Post-Anesthesia Vitals  Temp: 36.7 C (98.1 F) (12/23 1715)  Pulse: 75 (12/23 1715)  Respirations: 18 PER MINUTE (12/23 1715)  SpO2: 97 % (12/23 1715)  SpO2 Pulse: 75 (12/23 1715)  ABP: 127/49 (12/23 1719)   Vitals Value Taken Time   BP     Temp 36.7 C (98.1 F) 02/07/20 1715   Pulse 75 02/07/20 1715   Respirations 18 PER MINUTE 02/07/20 1715   SpO2 97 % 02/07/20 1715   ABP 127/49 02/07/20 1719   ART BP           Post Anesthesia Evaluation Note    Evaluation location: ICU  Patient participation: recovered; patient participated in evaluation  Level of consciousness: sleepy but conscious  Pain management: adequate    Hydration: normovolemia  Temperature: 36.0C - 38.4C  Airway patency: adequate    Perioperative Events       Post-op nausea and vomiting: no PONV    Postoperative Status  Cardiovascular status: hemodynamically stable  Respiratory status: spontaneous ventilation and supplemental oxygen  ICU Information    Blood Products Given-no                Staff involved in transport include: OR nurse and CRNA      Perioperative Events  Perioperative Event: No  Emergency Case Activation: No
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
## 2020-02-07 NOTE — Other
Bedside Procedure Note     Procedure: Arterial line insertion.    Indication: Need for continuous arterial blood pressure monitoring and or frequent arterial blood draws.    Description: Allens test was performed on the patients Left wrist and was deemed normal. The patients wrist and hand were prepped with chloraprep. A 20 Gauge arterial catheter was inserted into the radial artery. 1 needle stick(s) were required. A sterile dressing was placed over the arterial line.    Position was confirmed with the presence of pulsatile, bright red blood and appropriate wave form on the monitor.    Dr. Eliezer Bottom was immediately available at all times.    Neal Dy   PGY2

## 2020-02-07 NOTE — Progress Notes
Critical Care Attending Physician ATTESTATION    I have seen, personally fully evaluated, and discussed patient with patient, ICU nursing staff, resident.  I agree with the objective findings and agree with the plan of care as documented by the provider with the exceptions noted.  The patient is critically ill with the below problems.  I spent 50 minutes (excluding time spent performing or supervising any procedures) providing and personally directing critical care services including review of medical record, interpretation of laboratory and imaging values, sedation management, hemodynamic monitoring, titration of pressors, fluid management, coordination of care.    Staff name:  Earlean Polka, MD Date:  02/07/2020     - - Acute pain secondary to chest wall trauma: Multimodal pain control.  - Acute bilateral rib fractures with significant lost of chest wall trauma with left pneumohemothorax. Terrible pain control with need for non rebreather. After discussion with the patient will move forward with VATS and rib stabilization to minimize risk of respiratory failure requiring ventilatory support. Chest tube to suction.   - Compressive shock with concern for tension physiology improved with IV fuids and decompression of pneumothorax. Continue to monitor. Concern for hemorrhagic shock in setting of pelvic fracture: will transfuse 2 units pRBCS  - Needs CTA neck to rule out BCVI  -- Fractures: Comminuted left sacral alar, pubic symphysis, and inferior pubic ramus fractures with widening of the left sacroiliac joint with large retroperitoneal hematoma:   - Acute blood loss anemia  -acute kidney injury, foley catheter, fluid resuscitation  -lactice acidosis, will continue to trend  - Unable to rule out RP bladder injury due to significant pelvic hematoma; will need CT cysto. Foley catheter for decompression at this time.  - Decreased functional mobility: PT/OT   - Nutrition/BMR: NPO at this time for operative intervention - VTE Prophylaxis: Holding chemoprophylaxis in setting of acute bleedi  - GI Prophylaxis: NI  - Lines/Tube/Drains: PIV  - Code Status: full   - Disposition: pending  - Level of Care: remains ICU level of care
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
## 2020-02-08 ENCOUNTER — Inpatient Hospital Stay: Admit: 2020-02-08 | Discharge: 2020-02-08 | Payer: MEDICARE

## 2020-02-08 ENCOUNTER — Encounter: Admit: 2020-02-08 | Discharge: 2020-02-08 | Payer: MEDICARE

## 2020-02-08 MED ORDER — CEFAZOLIN 1 GRAM IJ SOLR
INTRAVENOUS | 0 refills | Status: DC
Start: 2020-02-08 — End: 2020-02-09
  Administered 2020-02-08: 21:00:00 2 g via INTRAVENOUS

## 2020-02-08 MED ORDER — ROPIVACAINE (PF) 2 MG/ML (0.2 %) IJ SOLN
0 refills | Status: CP
Start: 2020-02-08 — End: ?
  Administered 2020-02-09: 20 mL

## 2020-02-08 MED ORDER — PROPOFOL INJ 10 MG/ML IV VIAL
INTRAVENOUS | 0 refills | Status: DC
Start: 2020-02-08 — End: 2020-02-09
  Administered 2020-02-08: 21:00:00 50 mg via INTRAVENOUS

## 2020-02-08 MED ORDER — LIDOCAINE (PF) 10 MG/ML (1 %) IJ SOLN
SUBCUTANEOUS | 0 refills | Status: CP
Start: 2020-02-08 — End: ?
  Administered 2020-02-09: 5 mL via SUBCUTANEOUS

## 2020-02-08 MED ORDER — LABETALOL 5 MG/ML IV SYRG
INTRAVENOUS | 0 refills | Status: DC
Start: 2020-02-08 — End: 2020-02-09
  Administered 2020-02-09: 10 mg via INTRAVENOUS
  Administered 2020-02-09 (×2): 5 mg via INTRAVENOUS

## 2020-02-08 MED ORDER — KETAMINE 10 MG/ML IJ SOLN
INTRAVENOUS | 0 refills | Status: DC
Start: 2020-02-08 — End: 2020-02-09
  Administered 2020-02-08: 21:00:00 30 mg via INTRAVENOUS

## 2020-02-08 MED ORDER — SUGAMMADEX 100 MG/ML IV SOLN
INTRAVENOUS | 0 refills | Status: DC
Start: 2020-02-08 — End: 2020-02-09
  Administered 2020-02-09: 130 mg via INTRAVENOUS

## 2020-02-08 MED ORDER — LIDOCAINE (PF) 20 MG/ML (2 %) IJ SOLN
0 refills | Status: CP
Start: 2020-02-08 — End: ?
  Administered 2020-02-09: 10 mL

## 2020-02-08 MED ORDER — ELECTROLYTE-A IV SOLP
INTRAVENOUS | 0 refills | Status: DC
Start: 2020-02-08 — End: 2020-02-09
  Administered 2020-02-08: 21:00:00 via INTRAVENOUS

## 2020-02-08 MED ORDER — DEXAMETHASONE SODIUM PHOSPHATE 4 MG/ML IJ SOLN
INTRAVENOUS | 0 refills | Status: DC
Start: 2020-02-08 — End: 2020-02-09
  Administered 2020-02-08: 21:00:00 4 mg via INTRAVENOUS

## 2020-02-08 MED ORDER — DEXAMETHASONE SODIUM PHOSPHATE 10 MG/ML IJ SOLN
0 refills | Status: CP
Start: 2020-02-08 — End: ?
  Administered 2020-02-09: 4 mg

## 2020-02-08 MED ORDER — ROCURONIUM 10 MG/ML IV SOLN
INTRAVENOUS | 0 refills | Status: DC
Start: 2020-02-08 — End: 2020-02-09
  Administered 2020-02-08 (×2): 10 mg via INTRAVENOUS
  Administered 2020-02-08: 21:00:00 40 mg via INTRAVENOUS

## 2020-02-08 MED ORDER — ONDANSETRON HCL (PF) 4 MG/2 ML IJ SOLN
INTRAVENOUS | 0 refills | Status: DC
Start: 2020-02-08 — End: 2020-02-09
  Administered 2020-02-08: 4 mg via INTRAVENOUS

## 2020-02-08 MED ORDER — FENTANYL CITRATE (PF) 50 MCG/ML IJ SOLN
INTRAVENOUS | 0 refills | Status: DC
Start: 2020-02-08 — End: 2020-02-09
  Administered 2020-02-08 (×2): 50 ug via INTRAVENOUS
  Administered 2020-02-08: 21:00:00 100 ug via INTRAVENOUS
  Administered 2020-02-08: 22:00:00 50 ug via INTRAVENOUS

## 2020-02-08 MED ORDER — LIDOCAINE (PF) 200 MG/10 ML (2 %) IJ SYRG
INTRAVENOUS | 0 refills | Status: DC
Start: 2020-02-08 — End: 2020-02-09
  Administered 2020-02-08: 21:00:00 60 mg via INTRAVENOUS

## 2020-02-08 MED ADMIN — LACTATED RINGERS IV SOLP [4318]: 1000.000 mL | INTRAVENOUS | @ 15:00:00 | NDC 00338011704

## 2020-02-08 MED ADMIN — LEVOTHYROXINE 88 MCG PO TAB [10403]: 88 ug | ORAL | @ 16:00:00 | NDC 00904695261
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
## 2020-02-08 NOTE — Anesthesia Procedure Notes
Anesthesia Procedure: Peripheral Nerve Block    PERIPHERAL NERVE BLOCK    Date/Time: 02/07/2020 7:20 PM    Patient location: ICU  Reason for block: procedure for pain    Preprocedure checklist performed: 2 patient identifiers, risks & benefits discussed, patient evaluated, timeout performed, consent obtained and patient being monitored    Sterile technique:  - Proper hand washing  - Cap, mask  - Sterile gloves  - Skin prep for antisepsis        Peripheral Nerve Block Procedure   Patient position: supine  Prep: ChloraPrep    Monitoring: BP, EKG and continuous pulse ox  Block type: serratus plane (T3 and T5 level)  Laterality: left  Injection technique: single-shot  Procedures: ultrasound guided    Ultrasound image captured    Needle/cathether:      Needle type: Stimuplex      Needle gauge: 22 G; Needle length: 4 in     Needle location: anatomical landmarks and ultrasound guidance     Needle insertion depth: 4 cm    Procedure Medications    Local Anesthesia: lidocaine PF 1% (10 mg/mL) injection, 5 mL  Bolus Dose: bupivacaine (MARCAINE) 0.25% injection, 30 mL  Adjuvant Medications: dexamethasone (DECADRON) 10 mg/mL injection, 4 mg    Procedure Outcome   Injection assessment: negative aspiration for heme, no paresthesia on injection, incremental injection and local visualized surrounding nerve on ultrasound  Observations: adequate block, patient tolerated the procedure well with no immediate complications, comfortable throughout block and no sedation      Refer to nursing documentation for vitals and monitoring data during procedure.    Performed by: Aggie Hacker., MD  Authorized by: Christiana Pellant, MD

## 2020-02-09 MED ADMIN — LEVOTHYROXINE 88 MCG PO TAB [10403]: 88 ug | ORAL | @ 12:00:00 | NDC 00904695261

## 2020-02-09 MED ADMIN — LACTATED RINGERS IV SOLP [4318]: 1000.000 mL | INTRAVENOUS | @ 01:00:00 | NDC 00338011704

## 2020-02-09 MED ADMIN — DOCUSATE SODIUM 100 MG PO CAP [2566]: 100 mg | ORAL | @ 17:00:00 | NDC 00904699880

## 2020-02-09 MED ADMIN — POLYETHYLENE GLYCOL 3350 17 GRAM PO PWPK [25424]: 17 g | ORAL | @ 17:00:00 | NDC 00904693186

## 2020-02-09 MED ADMIN — HEPARIN, PORCINE (PF) 5,000 UNIT/0.5 ML IJ SYRG [95535]: 5000 [IU] | SUBCUTANEOUS | @ 19:00:00 | NDC 00409131611

## 2020-02-09 MED ADMIN — LACTATED RINGERS IV SOLP [4318]: 1000.000 mL | INTRAVENOUS | @ 14:00:00 | Stop: 2020-02-09 | NDC 00338011704

## 2020-02-09 MED ADMIN — FENTANYL PCA 500 MCG/50 ML SYR (STD CONC)(ADULT)(PREMADE) [213018]: 50.000 mL | INTRAVENOUS | @ 21:00:00 | NDC 54029373509

## 2020-02-10 MED ADMIN — POLYETHYLENE GLYCOL 3350 17 GRAM PO PWPK [25424]: 17 g | ORAL | @ 16:00:00 | NDC 00904693186

## 2020-02-10 MED ADMIN — OXYCODONE 5 MG PO TAB [10814]: 10 mg | ORAL | @ 23:00:00 | NDC 00904696661

## 2020-02-10 MED ADMIN — DOCUSATE SODIUM 100 MG PO CAP [2566]: 100 mg | ORAL | @ 16:00:00 | NDC 00904699880

## 2020-02-10 MED ADMIN — CALCIUM CARBONATE 200 MG CALCIUM (500 MG) PO CHEW [9385]: 500 mg | ORAL | @ 22:00:00 | Stop: 2020-02-10 | NDC 66553000401

## 2020-02-10 MED ADMIN — LEVOTHYROXINE 88 MCG PO TAB [10403]: 88 ug | ORAL | @ 12:00:00 | NDC 00904695261

## 2020-02-10 MED ADMIN — OXYCODONE 5 MG PO TAB [10814]: 10 mg | ORAL | @ 17:00:00 | NDC 00904696661

## 2020-02-10 MED ADMIN — ASPIRIN 81 MG PO TBEC [14113]: 81 mg | ORAL | @ 02:00:00 | NDC 00536123441

## 2020-02-10 MED ADMIN — HEPARIN, PORCINE (PF) 5,000 UNIT/0.5 ML IJ SYRG [95535]: 5000 [IU] | SUBCUTANEOUS | @ 04:00:00 | NDC 00409131611

## 2020-02-10 MED ADMIN — LACTATED RINGERS IV SOLP [4318]: 1000.000 mL | INTRAVENOUS | @ 04:00:00 | NDC 00338011704

## 2020-02-10 MED ADMIN — FENTANYL CITRATE (PF) 50 MCG/ML IJ SOLN [3037]: 25 ug | INTRAVENOUS | NDC 00641602701

## 2020-02-10 MED ADMIN — ATORVASTATIN 40 MG PO TAB [77113]: 80 mg | ORAL | @ 02:00:00 | NDC 00904629261

## 2020-02-10 MED ADMIN — ACETAMINOPHEN 500 MG PO TAB [102]: 1000 mg | ORAL | NDC 00904673061

## 2020-02-10 MED ADMIN — PANTOPRAZOLE 40 MG PO TBEC [80436]: 40 mg | ORAL | @ 17:00:00 | NDC 00904647461

## 2020-02-10 MED ADMIN — FENTANYL PCA 500 MCG/50 ML SYR (STD CONC)(ADULT)(PREMADE) [213018]: 50.000 mL | INTRAVENOUS | @ 07:00:00 | Stop: 2020-02-10 | NDC 54029373509

## 2020-02-10 MED ADMIN — DOCUSATE SODIUM 100 MG PO CAP [2566]: 100 mg | ORAL | @ 02:00:00 | NDC 00904699880

## 2020-02-10 MED ADMIN — HEPARIN, PORCINE (PF) 5,000 UNIT/0.5 ML IJ SYRG [95535]: 5000 [IU] | SUBCUTANEOUS | @ 12:00:00 | NDC 00409131611

## 2020-02-11 ENCOUNTER — Inpatient Hospital Stay: Admit: 2020-02-11 | Discharge: 2020-02-11 | Payer: MEDICARE

## 2020-02-11 ENCOUNTER — Encounter: Admit: 2020-02-11 | Discharge: 2020-02-11 | Payer: MEDICARE

## 2020-02-11 MED ORDER — DEXAMETHASONE SODIUM PHOSPHATE 10 MG/ML IJ SOLN
0 refills | Status: CP
Start: 2020-02-11 — End: ?
  Administered 2020-02-11: 16:00:00 4 mg

## 2020-02-11 MED ORDER — LIDOCAINE (PF) 10 MG/ML (1 %) IJ SOLN
SUBCUTANEOUS | 0 refills | Status: CP
Start: 2020-02-11 — End: ?
  Administered 2020-02-11: 16:00:00 5 mL via SUBCUTANEOUS

## 2020-02-11 MED ORDER — BUPIVACAINE HCL 0.25 % (2.5 MG/ML) IJ SOLN
0 refills | Status: CP
Start: 2020-02-11 — End: ?
  Administered 2020-02-11: 16:00:00 40 mL

## 2020-02-11 MED ADMIN — HEPARIN, PORCINE (PF) 5,000 UNIT/0.5 ML IJ SYRG [95535]: 5000 [IU] | SUBCUTANEOUS | @ 04:00:00 | NDC 00409131611

## 2020-02-11 MED ADMIN — FENTANYL CITRATE (PF) 50 MCG/ML IJ SOLN [3037]: 25 ug | INTRAVENOUS | @ 02:00:00 | NDC 00641602701

## 2020-02-11 MED ADMIN — ONDANSETRON 4 MG PO TBDI [82394]: 4 mg | ORAL | @ 14:00:00 | Stop: 2020-02-11 | NDC 68462015740

## 2020-02-11 MED ADMIN — ASPIRIN 81 MG PO TBEC [14113]: 81 mg | ORAL | @ 02:00:00 | NDC 00536123441

## 2020-02-11 MED ADMIN — BISACODYL 10 MG RE SUPP [1080]: 10 mg | RECTAL | @ 16:00:00 | NDC 00574705012

## 2020-02-11 MED ADMIN — OXYCODONE 5 MG PO TAB [10814]: 5 mg | ORAL | @ 06:00:00 | Stop: 2020-02-11 | NDC 00904696661

## 2020-02-11 MED ADMIN — DOCUSATE SODIUM 100 MG PO CAP [2566]: 100 mg | ORAL | @ 16:00:00 | NDC 00904699880

## 2020-02-11 MED ADMIN — HEPARIN, PORCINE (PF) 5,000 UNIT/0.5 ML IJ SYRG [95535]: 5000 [IU] | SUBCUTANEOUS | @ 12:00:00 | Stop: 2020-02-11 | NDC 00409131611

## 2020-02-11 MED ADMIN — OXYCODONE 5 MG PO TAB [10814]: 10 mg | ORAL | @ 04:00:00 | NDC 00904696661

## 2020-02-11 MED ADMIN — ACETAMINOPHEN 500 MG PO TAB [102]: 1000 mg | ORAL | @ 06:00:00 | NDC 00904673061

## 2020-02-11 MED ADMIN — POLYETHYLENE GLYCOL 3350 17 GRAM PO PWPK [25424]: 17 g | ORAL | @ 16:00:00 | NDC 00904693186

## 2020-02-11 MED ADMIN — OXYCODONE 5 MG PO TAB [10814]: 5 mg | ORAL | @ 10:00:00 | Stop: 2020-02-11 | NDC 00904696661

## 2020-02-11 MED ADMIN — PANTOPRAZOLE 40 MG PO TBEC [80436]: 40 mg | ORAL | @ 16:00:00 | NDC 00904647461

## 2020-02-11 MED ADMIN — OXYCODONE 5 MG PO TAB [10814]: 10 mg | ORAL | @ 16:00:00 | Stop: 2020-02-11 | NDC 00904696661

## 2020-02-11 MED ADMIN — LEVOTHYROXINE 88 MCG PO TAB [10403]: 88 ug | ORAL | @ 12:00:00 | NDC 00904695261

## 2020-02-11 MED ADMIN — GABAPENTIN 100 MG PO CAP [18309]: 200 mg | ORAL | @ 12:00:00 | NDC 67877022205

## 2020-02-11 MED ADMIN — DOCUSATE SODIUM 100 MG PO CAP [2566]: 100 mg | ORAL | @ 02:00:00 | NDC 00904699880

## 2020-02-11 MED ADMIN — GABAPENTIN 100 MG PO CAP [18309]: 200 mg | ORAL | @ 04:00:00 | NDC 67877022205

## 2020-02-11 MED ADMIN — OXYCODONE 5 MG PO TAB [10814]: 5 mg | ORAL | @ 02:00:00 | NDC 00904696661

## 2020-02-11 MED ADMIN — ACETAMINOPHEN 500 MG PO TAB [102]: 1000 mg | ORAL | @ 12:00:00 | NDC 00904673061

## 2020-02-11 MED ADMIN — ACETAMINOPHEN 500 MG PO TAB [102]: 1000 mg | ORAL | @ 23:00:00 | NDC 00904673061

## 2020-02-11 MED ADMIN — METHOCARBAMOL 500 MG PO TAB [4971]: 500 mg | ORAL | @ 21:00:00 | NDC 00904705761

## 2020-02-11 MED ADMIN — ACETAMINOPHEN 500 MG PO TAB [102]: 1000 mg | ORAL | @ 18:00:00 | NDC 00904673061

## 2020-02-11 MED ADMIN — GABAPENTIN 100 MG PO CAP [18309]: 200 mg | ORAL | @ 21:00:00 | NDC 67877022205

## 2020-02-11 MED ADMIN — LIDOCAINE 5 % TP PTMD [80759]: 2 | TOPICAL | @ 16:00:00 | NDC 00591352530

## 2020-02-11 MED ADMIN — FENTANYL CITRATE (PF) 50 MCG/ML IJ SOLN [3037]: 25 ug | INTRAVENOUS | @ 15:00:00 | Stop: 2020-02-11 | NDC 00641602701

## 2020-02-11 MED ADMIN — CALCIUM CARBONATE 200 MG CALCIUM (500 MG) PO CHEW [9385]: 500 mg | ORAL | @ 23:00:00 | NDC 66553000401

## 2020-02-11 NOTE — Anesthesia Procedure Notes
Anesthesia Procedure: Peripheral Nerve Block    PERIPHERAL NERVE BLOCK    Date/Time: 02/11/2020 10:00 AM    Patient location: ICU  Reason for block: at surgeon's request and post-op pain management    Preprocedure checklist performed: 2 patient identifiers, risks & benefits discussed, patient evaluated, timeout performed, consent obtained and patient being monitored        Peripheral Nerve Block Procedure   Patient position: supine  Prep: ChloraPrep    Monitoring: BP, EKG and continuous pulse ox  Block type: serratus plane  Laterality: left  Injection technique: single-shot  Procedures: ultrasound guided    Ultrasound image captured    Needle/cathether:      Needle type: Stimuplex      Needle gauge: 22 G; Needle length: 4 in     Needle location: anatomical landmarks and ultrasound guidance    Procedure Medications    Local Anesthesia: lidocaine PF 1% (10 mg/mL) injection, 5 mL  Bolus Dose: bupivacaine (MARCAINE) 0.25% injection, 40 mL  Adjuvant Medications: dexamethasone (DECADRON) 10 mg/mL injection, 4 mg    Procedure Outcome   Injection assessment: negative aspiration for heme, no paresthesia on injection, incremental injection and local visualized surrounding nerve on ultrasound  Observations: adequate block, patient sedated but conversant throughout block, patient tolerated the procedure well with no immediate complications and comfortable throughout block      Refer to nursing documentation for vitals and monitoring data during procedure.    Performed by: Raquel James, MD  Authorized by: Meade Maw, MD

## 2020-02-12 ENCOUNTER — Inpatient Hospital Stay: Admit: 2020-02-12 | Discharge: 2020-02-12 | Payer: MEDICARE

## 2020-02-12 ENCOUNTER — Encounter: Admit: 2020-02-12 | Discharge: 2020-02-12 | Payer: MEDICARE

## 2020-02-12 DIAGNOSIS — N289 Disorder of kidney and ureter, unspecified: Secondary | ICD-10-CM

## 2020-02-12 DIAGNOSIS — M810 Age-related osteoporosis without current pathological fracture: Secondary | ICD-10-CM

## 2020-02-12 DIAGNOSIS — M1990 Unspecified osteoarthritis, unspecified site: Secondary | ICD-10-CM

## 2020-02-12 DIAGNOSIS — E039 Hypothyroidism, unspecified: Secondary | ICD-10-CM

## 2020-02-12 DIAGNOSIS — N183 CKD (chronic kidney disease) stage 3, GFR 30-59 ml/min (HCC): Secondary | ICD-10-CM

## 2020-02-12 DIAGNOSIS — J449 Chronic obstructive pulmonary disease, unspecified: Secondary | ICD-10-CM

## 2020-02-12 DIAGNOSIS — I10 Essential (primary) hypertension: Secondary | ICD-10-CM

## 2020-02-12 DIAGNOSIS — S7291XA Unspecified fracture of right femur, initial encounter for closed fracture: Secondary | ICD-10-CM

## 2020-02-12 DIAGNOSIS — K319 Disease of stomach and duodenum, unspecified: Secondary | ICD-10-CM

## 2020-02-12 DIAGNOSIS — I519 Heart disease, unspecified: Secondary | ICD-10-CM

## 2020-02-12 DIAGNOSIS — E785 Hyperlipidemia, unspecified: Principal | ICD-10-CM

## 2020-02-12 MED ADMIN — PANTOPRAZOLE 40 MG PO TBEC [80436]: 40 mg | ORAL | @ 14:00:00 | NDC 00904647461

## 2020-02-12 MED ADMIN — SODIUM PHOSPHATE 3 MMOL/ML IV SOLN [7351]: 15 mmol | INTRAVENOUS | @ 12:00:00 | Stop: 2020-02-12 | NDC 63323017005

## 2020-02-12 MED ADMIN — ACETAMINOPHEN 500 MG PO TAB [102]: 1000 mg | ORAL | @ 11:00:00 | NDC 00904673061

## 2020-02-12 MED ADMIN — POLYETHYLENE GLYCOL 3350 17 GRAM PO PWPK [25424]: 17 g | ORAL | @ 14:00:00 | NDC 00904693186

## 2020-02-12 MED ADMIN — METHOCARBAMOL 500 MG PO TAB [4971]: 500 mg | ORAL | @ 14:00:00 | NDC 00904705761

## 2020-02-12 MED ADMIN — DEXTROSE 5% IN WATER IV SOLP [2364]: 15 mmol | INTRAVENOUS | @ 12:00:00 | Stop: 2020-02-12 | NDC 00338001702

## 2020-02-12 MED ADMIN — OXYCODONE 5 MG PO TAB [10814]: 5 mg | ORAL | @ 11:00:00 | NDC 00406055223

## 2020-02-12 MED ADMIN — LIDOCAINE 5 % TP PTMD [80759]: 2 | TOPICAL | @ 14:00:00 | NDC 00591352530

## 2020-02-12 MED ADMIN — METHOCARBAMOL 500 MG PO TAB [4971]: 500 mg | ORAL | @ 03:00:00 | NDC 00904705761

## 2020-02-12 MED ADMIN — IPRATROPIUM-ALBUTEROL 0.5 MG-3 MG(2.5 MG BASE)/3 ML IN NEBU [77459]: 3 mL | RESPIRATORY_TRACT | @ 18:00:00 | Stop: 2020-02-12 | NDC 69097084034

## 2020-02-12 MED ADMIN — ACETAMINOPHEN 500 MG PO TAB [102]: 1000 mg | ORAL | @ 23:00:00 | NDC 00904673061

## 2020-02-12 MED ADMIN — OXYCODONE 5 MG PO TAB [10814]: 5 mg | ORAL | @ 20:00:00 | NDC 00904696661

## 2020-02-12 MED ADMIN — LEVOTHYROXINE 88 MCG PO TAB [10403]: 88 ug | ORAL | @ 12:00:00 | Stop: 2020-02-13 | NDC 00904695261

## 2020-02-12 MED ADMIN — POTASSIUM, SODIUM PHOSPHATES 280-160-250 MG PO PWPK [166235]: 2 | ORAL | @ 12:00:00 | Stop: 2020-02-12 | NDC 60258000615

## 2020-02-12 MED ADMIN — ACETAMINOPHEN 500 MG PO TAB [102]: 1000 mg | ORAL | @ 17:00:00 | NDC 00904673061

## 2020-02-12 MED ADMIN — ENOXAPARIN 30 MG/0.3 ML SC SYRG [85048]: 30 mg | SUBCUTANEOUS | @ 14:00:00 | NDC 00781323801

## 2020-02-12 MED ADMIN — DOCUSATE SODIUM 100 MG PO CAP [2566]: 100 mg | ORAL | @ 14:00:00 | NDC 00904699880

## 2020-02-12 MED ADMIN — IPRATROPIUM-ALBUTEROL 0.5 MG-3 MG(2.5 MG BASE)/3 ML IN NEBU [77459]: 3 mL | RESPIRATORY_TRACT | @ 22:00:00 | Stop: 2020-02-12 | NDC 69097084034

## 2020-02-12 MED ADMIN — ENOXAPARIN 30 MG/0.3 ML SC SYRG [85048]: 30 mg | SUBCUTANEOUS | @ 03:00:00 | NDC 00781323801

## 2020-02-12 MED ADMIN — BISACODYL 10 MG RE SUPP [1080]: 10 mg | RECTAL | @ 03:00:00 | NDC 00574705012

## 2020-02-12 MED ADMIN — GABAPENTIN 100 MG PO CAP [18309]: 200 mg | ORAL | @ 20:00:00 | NDC 67877022205

## 2020-02-12 MED ADMIN — BISACODYL 10 MG RE SUPP [1080]: 10 mg | RECTAL | @ 14:00:00 | NDC 00574705012

## 2020-02-12 MED ADMIN — OXYCODONE 5 MG PO TAB [10814]: 5 mg | ORAL | @ 04:00:00 | NDC 00406055223

## 2020-02-12 MED ADMIN — ASPIRIN 81 MG PO TBEC [14113]: 81 mg | ORAL | @ 03:00:00 | NDC 00536123441

## 2020-02-12 MED ADMIN — FENTANYL CITRATE (PF) 50 MCG/ML IJ SOLN [3037]: 25 ug | INTRAVENOUS | @ 06:00:00 | NDC 00641602701

## 2020-02-12 MED ADMIN — GABAPENTIN 100 MG PO CAP [18309]: 200 mg | ORAL | @ 12:00:00 | NDC 67877022205

## 2020-02-12 MED ADMIN — METHOCARBAMOL 500 MG PO TAB [4971]: 500 mg | ORAL | @ 20:00:00 | NDC 00904705761

## 2020-02-12 MED ADMIN — DOCUSATE SODIUM 100 MG PO CAP [2566]: 100 mg | ORAL | @ 03:00:00 | NDC 00904699880

## 2020-02-12 MED ADMIN — OXYCODONE 5 MG PO TAB [10814]: 5 mg | ORAL | @ 17:00:00 | NDC 00406055223

## 2020-02-12 MED ADMIN — GABAPENTIN 100 MG PO CAP [18309]: 200 mg | ORAL | @ 03:00:00 | NDC 67877022205

## 2020-02-13 MED ADMIN — ACETAMINOPHEN 500 MG PO TAB [102]: 1000 mg | ORAL | @ 07:00:00 | NDC 00904673061

## 2020-02-13 MED ADMIN — LEVOTHYROXINE 50 MCG PO TAB [4421]: 50 ug | ORAL | @ 13:00:00 | NDC 51079044001

## 2020-02-13 MED ADMIN — ENOXAPARIN 30 MG/0.3 ML SC SYRG [85048]: 30 mg | SUBCUTANEOUS | @ 14:00:00 | NDC 63323053301

## 2020-02-13 MED ADMIN — ACETAMINOPHEN 500 MG PO TAB [102]: 1000 mg | ORAL | @ 16:00:00 | NDC 00904673061

## 2020-02-13 MED ADMIN — LIDOCAINE 5 % TP PTMD [80759]: 2 | TOPICAL | @ 14:00:00 | NDC 00591352530

## 2020-02-13 MED ADMIN — METHOCARBAMOL 500 MG PO TAB [4971]: 500 mg | ORAL | @ 03:00:00 | NDC 00904705761

## 2020-02-13 MED ADMIN — FENTANYL CITRATE (PF) 50 MCG/ML IJ SOLN [3037]: 25 ug | INTRAVENOUS | @ 23:00:00 | NDC 00641602701

## 2020-02-13 MED ADMIN — IPRATROPIUM-ALBUTEROL 0.5 MG-3 MG(2.5 MG BASE)/3 ML IN NEBU [77459]: 3 mL | RESPIRATORY_TRACT | @ 02:00:00 | NDC 69097084034

## 2020-02-13 MED ADMIN — ENOXAPARIN 30 MG/0.3 ML SC SYRG [85048]: 30 mg | SUBCUTANEOUS | @ 03:00:00 | NDC 00781323801

## 2020-02-13 MED ADMIN — DOCUSATE SODIUM 100 MG PO CAP [2566]: 100 mg | ORAL | @ 15:00:00 | NDC 00904699880

## 2020-02-13 MED ADMIN — FENTANYL CITRATE (PF) 50 MCG/ML IJ SOLN [3037]: 25 ug | INTRAVENOUS | @ 10:00:00 | NDC 00641602701

## 2020-02-13 MED ADMIN — FENTANYL CITRATE (PF) 50 MCG/ML IJ SOLN [3037]: 25 ug | INTRAVENOUS | @ 05:00:00 | NDC 00641602701

## 2020-02-13 MED ADMIN — OXYCODONE 5 MG PO TAB [10814]: 7.5 mg | ORAL | @ 16:00:00 | NDC 00406055223

## 2020-02-13 MED ADMIN — FENTANYL CITRATE (PF) 50 MCG/ML IJ SOLN [3037]: 25 ug | INTRAVENOUS | @ 04:00:00 | NDC 00641602701

## 2020-02-13 MED ADMIN — PANTOPRAZOLE 40 MG PO TBEC [80436]: 40 mg | ORAL | @ 14:00:00 | NDC 00904647461

## 2020-02-13 MED ADMIN — GABAPENTIN 100 MG PO CAP [18309]: 200 mg | ORAL | @ 03:00:00 | NDC 67877022205

## 2020-02-13 MED ADMIN — IPRATROPIUM-ALBUTEROL 0.5 MG-3 MG(2.5 MG BASE)/3 ML IN NEBU [77459]: 3 mL | RESPIRATORY_TRACT | @ 22:00:00 | NDC 69097084034

## 2020-02-13 MED ADMIN — METHOCARBAMOL 500 MG PO TAB [4971]: 500 mg | ORAL | @ 21:00:00 | NDC 00904705761

## 2020-02-13 MED ADMIN — METHOCARBAMOL 500 MG PO TAB [4971]: 500 mg | ORAL | @ 14:00:00 | NDC 00904705761

## 2020-02-13 MED ADMIN — OXYCODONE 5 MG PO TAB [10814]: 7.5 mg | ORAL | @ 21:00:00 | NDC 00406055223

## 2020-02-13 MED ADMIN — IPRATROPIUM-ALBUTEROL 0.5 MG-3 MG(2.5 MG BASE)/3 ML IN NEBU [77459]: 3 mL | RESPIRATORY_TRACT | @ 19:00:00 | NDC 69097084034

## 2020-02-13 MED ADMIN — DOCUSATE SODIUM 100 MG PO CAP [2566]: 100 mg | ORAL | @ 03:00:00 | NDC 00904699880

## 2020-02-13 MED ADMIN — FENTANYL CITRATE (PF) 50 MCG/ML IJ SOLN [3037]: 25 ug | INTRAVENOUS | @ 20:00:00 | NDC 00641602701

## 2020-02-13 MED ADMIN — OXYCODONE 5 MG PO TAB [10814]: 5 mg | ORAL | @ 12:00:00 | Stop: 2020-02-13 | NDC 00406055223

## 2020-02-13 MED ADMIN — GABAPENTIN 300 MG PO CAP [18308]: 300 mg | ORAL | @ 20:00:00 | NDC 00904666661

## 2020-02-13 MED ADMIN — OXYCODONE 5 MG PO TAB [10814]: 5 mg | ORAL | @ 02:00:00 | NDC 00406055223

## 2020-02-13 MED ADMIN — ACETAMINOPHEN 500 MG PO TAB [102]: 1000 mg | ORAL | @ 23:00:00 | NDC 00904673061

## 2020-02-13 MED ADMIN — FENTANYL CITRATE (PF) 50 MCG/ML IJ SOLN [3037]: 25 ug | INTRAVENOUS | @ 15:00:00 | NDC 00641602701

## 2020-02-13 MED ADMIN — ASPIRIN 81 MG PO TBEC [14113]: 81 mg | ORAL | @ 03:00:00 | NDC 00536123441

## 2020-02-13 MED ADMIN — POLYETHYLENE GLYCOL 3350 17 GRAM PO PWPK [25424]: 17 g | ORAL | @ 15:00:00 | NDC 00904693186

## 2020-02-13 MED ADMIN — OXYCODONE 5 MG PO TAB [10814]: 5 mg | ORAL | @ 07:00:00 | Stop: 2020-02-13 | NDC 00406055223

## 2020-02-13 MED ADMIN — BISACODYL 10 MG RE SUPP [1080]: 10 mg | RECTAL | @ 03:00:00 | NDC 00574705012

## 2020-02-13 MED ADMIN — GABAPENTIN 100 MG PO CAP [18309]: 200 mg | ORAL | @ 12:00:00 | Stop: 2020-02-13 | NDC 67877022205

## 2020-02-13 MED ADMIN — ACETAMINOPHEN 500 MG PO TAB [102]: 1000 mg | ORAL | @ 12:00:00 | NDC 00904673061

## 2020-02-14 MED ADMIN — GABAPENTIN 300 MG PO CAP [18308]: 300 mg | ORAL | @ 04:00:00 | NDC 00904666661

## 2020-02-14 MED ADMIN — METHOCARBAMOL 500 MG PO TAB [4971]: 500 mg | ORAL | @ 03:00:00 | NDC 00904705761

## 2020-02-14 MED ADMIN — DOCUSATE SODIUM 100 MG PO CAP [2566]: 100 mg | ORAL | @ 03:00:00 | NDC 00904699880

## 2020-02-14 MED ADMIN — ENOXAPARIN 30 MG/0.3 ML SC SYRG [85048]: 30 mg | SUBCUTANEOUS | @ 03:00:00 | NDC 63323053301

## 2020-02-14 MED ADMIN — SODIUM CHLORIDE 0.9 % IV SOLP [27838]: 250 mL | INTRAVENOUS | @ 16:00:00 | Stop: 2020-02-14 | NDC 00338004902

## 2020-02-14 MED ADMIN — OXYCODONE 5 MG PO TAB [10814]: 7.5 mg | ORAL | @ 03:00:00 | NDC 00904696661

## 2020-02-14 MED ADMIN — METHOCARBAMOL 500 MG PO TAB [4971]: 500 mg | ORAL | @ 22:00:00 | NDC 00904705761

## 2020-02-14 MED ADMIN — METHOCARBAMOL 500 MG PO TAB [4971]: 500 mg | ORAL | @ 16:00:00 | NDC 00904705761

## 2020-02-14 MED ADMIN — OXYCODONE 5 MG PO TAB [10814]: 7.5 mg | ORAL | @ 22:00:00 | NDC 00406055223

## 2020-02-14 MED ADMIN — IPRATROPIUM-ALBUTEROL 0.5 MG-3 MG(2.5 MG BASE)/3 ML IN NEBU [77459]: 3 mL | RESPIRATORY_TRACT | @ 03:00:00 | NDC 69097084034

## 2020-02-14 MED ADMIN — MAGNESIUM SULFATE IN WATER 4 GRAM/50 ML (8 %) IV PGBK [166563]: 4 g | INTRAVENOUS | @ 16:00:00 | Stop: 2020-02-14 | NDC 00338171940

## 2020-02-14 MED ADMIN — PANTOPRAZOLE 40 MG PO TBEC [80436]: 40 mg | ORAL | @ 16:00:00 | NDC 00904647461

## 2020-02-14 MED ADMIN — FENTANYL CITRATE (PF) 50 MCG/ML IJ SOLN [3037]: 12.5 ug | INTRAVENOUS | @ 19:00:00 | Stop: 2020-02-15 | NDC 00641602701

## 2020-02-14 MED ADMIN — IPRATROPIUM-ALBUTEROL 0.5 MG-3 MG(2.5 MG BASE)/3 ML IN NEBU [77459]: 3 mL | RESPIRATORY_TRACT | @ 22:00:00 | NDC 69097017348

## 2020-02-14 MED ADMIN — POLYETHYLENE GLYCOL 3350 17 GRAM PO PWPK [25424]: 17 g | ORAL | @ 16:00:00 | NDC 00904693186

## 2020-02-14 MED ADMIN — OXYCODONE 5 MG PO TAB [10814]: 7.5 mg | ORAL | @ 11:00:00 | NDC 00406055223

## 2020-02-14 MED ADMIN — DOCUSATE SODIUM 100 MG PO CAP [2566]: 100 mg | ORAL | @ 16:00:00 | NDC 00904699880

## 2020-02-14 MED ADMIN — IPRATROPIUM-ALBUTEROL 0.5 MG-3 MG(2.5 MG BASE)/3 ML IN NEBU [77459]: 3 mL | RESPIRATORY_TRACT | @ 18:00:00 | NDC 69097017348

## 2020-02-14 MED ADMIN — OXYCODONE 5 MG PO TAB [10814]: 7.5 mg | ORAL | @ 16:00:00 | NDC 00406055223

## 2020-02-14 MED ADMIN — GABAPENTIN 300 MG PO CAP [18308]: 300 mg | ORAL | @ 20:00:00 | NDC 00904666661

## 2020-02-14 MED ADMIN — ACETAMINOPHEN 500 MG PO TAB [102]: 1000 mg | ORAL | @ 11:00:00 | NDC 00904673061

## 2020-02-14 MED ADMIN — CALCIUM CARBONATE 200 MG CALCIUM (500 MG) PO CHEW [9385]: 500 mg | ORAL | @ 20:00:00 | NDC 66553000401

## 2020-02-14 MED ADMIN — OXYCODONE 5 MG PO TAB [10814]: 7.5 mg | ORAL | @ 07:00:00 | NDC 00406055223

## 2020-02-14 MED ADMIN — GABAPENTIN 300 MG PO CAP [18308]: 300 mg | ORAL | @ 13:00:00 | NDC 00904666661

## 2020-02-14 MED ADMIN — LEVOTHYROXINE 75 MCG PO TAB [4422]: 75 ug | ORAL | @ 13:00:00 | NDC 51079044101

## 2020-02-14 MED ADMIN — IPRATROPIUM-ALBUTEROL 0.5 MG-3 MG(2.5 MG BASE)/3 ML IN NEBU [77459]: 3 mL | RESPIRATORY_TRACT | @ 14:00:00 | NDC 69097017348

## 2020-02-14 MED ADMIN — LIDOCAINE 5 % TP PTMD [80759]: 2 | TOPICAL | @ 16:00:00 | NDC 00591352530

## 2020-02-14 MED ADMIN — ACETAMINOPHEN 500 MG PO TAB [102]: 1000 mg | ORAL | @ 05:00:00 | NDC 00904673061

## 2020-02-14 MED ADMIN — ACETAMINOPHEN 500 MG PO TAB [102]: 1000 mg | ORAL | @ 22:00:00 | NDC 00904673061

## 2020-02-14 MED ADMIN — ASPIRIN 81 MG PO TBEC [14113]: 81 mg | ORAL | @ 03:00:00 | NDC 00536123441

## 2020-02-15 MED ADMIN — GABAPENTIN 300 MG PO CAP [18308]: 300 mg | ORAL | @ 03:00:00 | NDC 00904666661

## 2020-02-15 MED ADMIN — PANTOPRAZOLE 40 MG PO TBEC [80436]: 40 mg | ORAL | @ 16:00:00 | NDC 00904647461

## 2020-02-15 MED ADMIN — METHOCARBAMOL 500 MG PO TAB [4971]: 500 mg | ORAL | @ 16:00:00 | NDC 00904705761

## 2020-02-15 MED ADMIN — GABAPENTIN 300 MG PO CAP [18308]: 300 mg | ORAL | @ 12:00:00 | NDC 00904666661

## 2020-02-15 MED ADMIN — IPRATROPIUM-ALBUTEROL 0.5 MG-3 MG(2.5 MG BASE)/3 ML IN NEBU [77459]: 3 mL | RESPIRATORY_TRACT | @ 03:00:00 | NDC 69097017348

## 2020-02-15 MED ADMIN — ACETAMINOPHEN 500 MG PO TAB [102]: 1000 mg | ORAL | @ 16:00:00 | NDC 00904673061

## 2020-02-15 MED ADMIN — ENOXAPARIN 30 MG/0.3 ML SC SYRG [85048]: 30 mg | SUBCUTANEOUS | @ 03:00:00 | NDC 00703853021

## 2020-02-15 MED ADMIN — ALUM-MAG HYDROXIDE-SIMETH 200-200-20 MG/5 ML PO SUSP [38285]: 30 mL | ORAL | @ 20:00:00 | Stop: 2020-02-15 | NDC 00121176130

## 2020-02-15 MED ADMIN — OXYCODONE 5 MG PO TAB [10814]: 5 mg | ORAL | @ 16:00:00 | Stop: 2020-02-15 | NDC 00406055223

## 2020-02-15 MED ADMIN — OXYCODONE 5 MG PO TAB [10814]: 5 mg | ORAL | NDC 00406055223

## 2020-02-15 MED ADMIN — IPRATROPIUM-ALBUTEROL 0.5 MG-3 MG(2.5 MG BASE)/3 ML IN NEBU [77459]: 3 mL | RESPIRATORY_TRACT | @ 15:00:00 | NDC 69097017348

## 2020-02-15 MED ADMIN — LIDOCAINE 5 % TP PTMD [80759]: 2 | TOPICAL | @ 16:00:00 | NDC 00591352530

## 2020-02-15 MED ADMIN — OXYCODONE 5 MG PO TAB [10814]: 5 mg | ORAL | @ 05:00:00 | NDC 00406055223

## 2020-02-15 MED ADMIN — METHOCARBAMOL 500 MG PO TAB [4971]: 500 mg | ORAL | @ 21:00:00 | NDC 00904705761

## 2020-02-15 MED ADMIN — IPRATROPIUM-ALBUTEROL 0.5 MG-3 MG(2.5 MG BASE)/3 ML IN NEBU [77459]: 3 mL | RESPIRATORY_TRACT | @ 22:00:00 | NDC 69097017348

## 2020-02-15 MED ADMIN — METHOCARBAMOL 500 MG PO TAB [4971]: 500 mg | ORAL | @ 03:00:00 | NDC 00904705761

## 2020-02-15 MED ADMIN — ASPIRIN 81 MG PO TBEC [14113]: 81 mg | ORAL | @ 03:00:00 | NDC 63739021202

## 2020-02-15 MED ADMIN — ACETAMINOPHEN 500 MG PO TAB [102]: 1000 mg | ORAL | @ 10:00:00 | NDC 00904673061

## 2020-02-15 MED ADMIN — ENOXAPARIN 30 MG/0.3 ML SC SYRG [85048]: 30 mg | SUBCUTANEOUS | @ 16:00:00 | NDC 00703853021

## 2020-02-15 MED ADMIN — KETOROLAC 15 MG/ML IJ SOLN [22472]: 15 mg | INTRAVENOUS | @ 21:00:00 | Stop: 2020-02-15 | NDC 00409379319

## 2020-02-15 MED ADMIN — OXYCODONE 5 MG PO TAB [10814]: 5 mg | ORAL | @ 20:00:00 | Stop: 2020-02-15 | NDC 00406055223

## 2020-02-15 MED ADMIN — DOCUSATE SODIUM 100 MG PO CAP [2566]: 100 mg | ORAL | @ 16:00:00 | NDC 00904699880

## 2020-02-15 MED ADMIN — OXYCODONE 5 MG PO TAB [10814]: 5 mg | ORAL | @ 10:00:00 | Stop: 2020-02-15 | NDC 00406055223

## 2020-02-15 MED ADMIN — ACETAMINOPHEN 500 MG PO TAB [102]: 1000 mg | ORAL | NDC 00904673061

## 2020-02-15 MED ADMIN — LEVOTHYROXINE 50 MCG PO TAB [4421]: 50 ug | ORAL | @ 12:00:00 | NDC 51079044001

## 2020-02-15 MED ADMIN — ACETAMINOPHEN 500 MG PO TAB [102]: 1000 mg | ORAL | @ 03:00:00 | NDC 00904673061

## 2020-02-15 MED ADMIN — DOCUSATE SODIUM 100 MG PO CAP [2566]: 100 mg | ORAL | @ 03:00:00 | NDC 00904699880

## 2020-02-15 MED ADMIN — POLYETHYLENE GLYCOL 3350 17 GRAM PO PWPK [25424]: 17 g | ORAL | @ 16:00:00 | NDC 00904693186

## 2020-02-15 MED ADMIN — GABAPENTIN 300 MG PO CAP [18308]: 300 mg | ORAL | @ 21:00:00 | NDC 00904666661

## 2020-02-15 MED ADMIN — TAMSULOSIN 0.4 MG PO CAP [80077]: 0.4 mg | ORAL | @ 16:00:00 | NDC 00904640161

## 2020-02-16 MED ADMIN — LEVOTHYROXINE 75 MCG PO TAB [4422]: 75 ug | ORAL | @ 12:00:00 | NDC 51079044101

## 2020-02-16 MED ADMIN — POLYETHYLENE GLYCOL 3350 17 GRAM PO PWPK [25424]: 17 g | ORAL | @ 15:00:00 | NDC 00904693186

## 2020-02-16 MED ADMIN — MELATONIN 5 MG PO TAB [168576]: 5 mg | ORAL | @ 02:00:00 | NDC 77333052025

## 2020-02-16 MED ADMIN — ALUM-MAG HYDROXIDE-SIMETH 200-200-20 MG/5 ML PO SUSP [38285]: 30 mL | ORAL | @ 11:00:00 | NDC 00121176130

## 2020-02-16 MED ADMIN — GABAPENTIN 300 MG PO CAP [18308]: 300 mg | ORAL | @ 11:00:00 | NDC 00904666661

## 2020-02-16 MED ADMIN — METHOCARBAMOL 500 MG PO TAB [4971]: 500 mg | ORAL | @ 02:00:00 | NDC 00904705761

## 2020-02-16 MED ADMIN — OXYCODONE 5 MG PO TAB [10814]: 10 mg | ORAL | @ 15:00:00 | NDC 00406055223

## 2020-02-16 MED ADMIN — ENOXAPARIN 30 MG/0.3 ML SC SYRG [85048]: 30 mg | SUBCUTANEOUS | @ 02:00:00 | NDC 60505079100

## 2020-02-16 MED ADMIN — ENOXAPARIN 30 MG/0.3 ML SC SYRG [85048]: 30 mg | SUBCUTANEOUS | @ 15:00:00 | NDC 00703853021

## 2020-02-16 MED ADMIN — METHOCARBAMOL 500 MG PO TAB [4971]: 500 mg | ORAL | @ 21:00:00 | NDC 00904705761

## 2020-02-16 MED ADMIN — OXYCODONE 5 MG PO TAB [10814]: 10 mg | ORAL | @ 11:00:00 | NDC 00406055223

## 2020-02-16 MED ADMIN — OXYCODONE 5 MG PO TAB [10814]: 10 mg | ORAL | @ 06:00:00 | NDC 00406055223

## 2020-02-16 MED ADMIN — GABAPENTIN 300 MG PO CAP [18308]: 300 mg | ORAL | @ 19:00:00 | NDC 00904666661

## 2020-02-16 MED ADMIN — DOCUSATE SODIUM 100 MG PO CAP [2566]: 100 mg | ORAL | @ 02:00:00 | NDC 00904699880

## 2020-02-16 MED ADMIN — ACETAMINOPHEN 500 MG PO TAB [102]: 1000 mg | ORAL | @ 19:00:00 | NDC 00904673061

## 2020-02-16 MED ADMIN — ASPIRIN 81 MG PO TBEC [14113]: 81 mg | ORAL | @ 02:00:00 | NDC 63739021202

## 2020-02-16 MED ADMIN — IPRATROPIUM-ALBUTEROL 0.5 MG-3 MG(2.5 MG BASE)/3 ML IN NEBU [77459]: 3 mL | RESPIRATORY_TRACT | @ 16:00:00 | NDC 69097084053

## 2020-02-16 MED ADMIN — LIDOCAINE 5 % TP PTMD [80759]: 2 | TOPICAL | @ 15:00:00 | NDC 00591352530

## 2020-02-16 MED ADMIN — TAMSULOSIN 0.4 MG PO CAP [80077]: 0.4 mg | ORAL | @ 15:00:00 | NDC 00904640161

## 2020-02-16 MED ADMIN — OXYCODONE 5 MG PO TAB [10814]: 5 mg | ORAL | @ 19:00:00 | NDC 00406055223

## 2020-02-16 MED ADMIN — DOCUSATE SODIUM 100 MG PO CAP [2566]: 100 mg | ORAL | @ 15:00:00 | NDC 00904699880

## 2020-02-16 MED ADMIN — METHOCARBAMOL 500 MG PO TAB [4971]: 500 mg | ORAL | @ 15:00:00 | NDC 00904705761

## 2020-02-16 MED ADMIN — ALUM-MAG HYDROXIDE-SIMETH 200-200-20 MG/5 ML PO SUSP [38285]: 30 mL | ORAL | @ 15:00:00 | NDC 00121176130

## 2020-02-16 MED ADMIN — PANTOPRAZOLE 40 MG PO TBEC [80436]: 40 mg | ORAL | @ 15:00:00 | NDC 00904647461

## 2020-02-17 MED ADMIN — ACETAMINOPHEN 500 MG PO TAB [102]: 1000 mg | ORAL | @ 10:00:00 | NDC 00904673061

## 2020-02-17 MED ADMIN — OXYCODONE 5 MG PO TAB [10814]: 10 mg | ORAL | @ 20:00:00 | NDC 00406055223

## 2020-02-17 MED ADMIN — OXYCODONE 5 MG PO TAB [10814]: 5 mg | ORAL | @ 02:00:00 | NDC 00406055223

## 2020-02-17 MED ADMIN — GABAPENTIN 300 MG PO CAP [18308]: 300 mg | ORAL | @ 13:00:00 | NDC 00904666661

## 2020-02-17 MED ADMIN — PANTOPRAZOLE 40 MG PO TBEC [80436]: 40 mg | ORAL | @ 16:00:00 | NDC 00904647461

## 2020-02-17 MED ADMIN — ACETAMINOPHEN 500 MG PO TAB [102]: 1000 mg | ORAL | @ 22:00:00 | NDC 00904673061

## 2020-02-17 MED ADMIN — OXYCODONE 5 MG PO TAB [10814]: 5 mg | ORAL | @ 16:00:00 | NDC 00406055223

## 2020-02-17 MED ADMIN — ENOXAPARIN 30 MG/0.3 ML SC SYRG [85048]: 30 mg | SUBCUTANEOUS | @ 16:00:00 | NDC 00781323801

## 2020-02-17 MED ADMIN — LEVOTHYROXINE 50 MCG PO TAB [4421]: 50 ug | ORAL | @ 14:00:00 | NDC 51079044001

## 2020-02-17 MED ADMIN — LIDOCAINE 5 % TP PTMD [80759]: 2 | TOPICAL | @ 16:00:00 | NDC 00591352530

## 2020-02-17 MED ADMIN — ENOXAPARIN 30 MG/0.3 ML SC SYRG [85048]: 30 mg | SUBCUTANEOUS | @ 03:00:00 | NDC 60505079100

## 2020-02-17 MED ADMIN — ACETAMINOPHEN 500 MG PO TAB [102]: 1000 mg | ORAL | @ 16:00:00 | NDC 00904673061

## 2020-02-17 MED ADMIN — METHOCARBAMOL 500 MG PO TAB [4971]: 500 mg | ORAL | @ 16:00:00 | NDC 00904705761

## 2020-02-17 MED ADMIN — MELATONIN 5 MG PO TAB [168576]: 5 mg | ORAL | @ 03:00:00 | NDC 77333052025

## 2020-02-17 MED ADMIN — GABAPENTIN 300 MG PO CAP [18308]: 300 mg | ORAL | @ 03:00:00 | NDC 00904666661

## 2020-02-17 MED ADMIN — GABAPENTIN 300 MG PO CAP [18308]: 300 mg | ORAL | @ 22:00:00 | NDC 00904666661

## 2020-02-17 MED ADMIN — TAMSULOSIN 0.4 MG PO CAP [80077]: 0.4 mg | ORAL | @ 16:00:00 | NDC 00904640161

## 2020-02-17 MED ADMIN — METHOCARBAMOL 500 MG PO TAB [4971]: 500 mg | ORAL | @ 03:00:00 | NDC 00904705761

## 2020-02-17 MED ADMIN — ASPIRIN 81 MG PO TBEC [14113]: 81 mg | ORAL | @ 03:00:00 | NDC 63739021202

## 2020-02-17 MED ADMIN — IPRATROPIUM-ALBUTEROL 0.5 MG-3 MG(2.5 MG BASE)/3 ML IN NEBU [77459]: 3 mL | RESPIRATORY_TRACT | @ 23:00:00 | NDC 00378967131

## 2020-02-17 MED ADMIN — ALUM-MAG HYDROXIDE-SIMETH 200-200-20 MG/5 ML PO SUSP [38285]: 30 mL | ORAL | @ 16:00:00 | NDC 00121176130

## 2020-02-17 MED ADMIN — OXYCODONE 5 MG PO TAB [10814]: 5 mg | ORAL | @ 03:00:00 | NDC 00406055223

## 2020-02-17 MED ADMIN — ALUM-MAG HYDROXIDE-SIMETH 200-200-20 MG/5 ML PO SUSP [38285]: 30 mL | ORAL | @ 02:00:00 | NDC 00121176130

## 2020-02-17 MED ADMIN — METHOCARBAMOL 500 MG PO TAB [4971]: 500 mg | ORAL | @ 22:00:00 | NDC 00904705761

## 2020-02-18 ENCOUNTER — Inpatient Hospital Stay: Admit: 2020-02-18 | Discharge: 2020-02-18 | Payer: MEDICARE

## 2020-02-18 MED ADMIN — LIDOCAINE 5 % TP PTMD [80759]: 2 | TOPICAL | @ 15:00:00 | NDC 00591352530

## 2020-02-18 MED ADMIN — OXYCODONE 5 MG PO TAB [10814]: 10 mg | ORAL | @ 12:00:00 | NDC 00406055223

## 2020-02-18 MED ADMIN — IPRATROPIUM-ALBUTEROL 0.5 MG-3 MG(2.5 MG BASE)/3 ML IN NEBU [77459]: 3 mL | RESPIRATORY_TRACT | @ 20:00:00 | NDC 00378967131

## 2020-02-18 MED ADMIN — METHOCARBAMOL 500 MG PO TAB [4971]: 500 mg | ORAL | @ 15:00:00 | NDC 00904705761

## 2020-02-18 MED ADMIN — METHOCARBAMOL 500 MG PO TAB [4971]: 500 mg | ORAL | @ 21:00:00 | NDC 00904705761

## 2020-02-18 MED ADMIN — ENOXAPARIN 30 MG/0.3 ML SC SYRG [85048]: 30 mg | SUBCUTANEOUS | @ 15:00:00 | NDC 60505079100

## 2020-02-18 MED ADMIN — IPRATROPIUM-ALBUTEROL 0.5 MG-3 MG(2.5 MG BASE)/3 ML IN NEBU [77459]: 3 mL | RESPIRATORY_TRACT | @ 23:00:00 | NDC 00378967131

## 2020-02-18 MED ADMIN — PANTOPRAZOLE 40 MG PO TBEC [80436]: 40 mg | ORAL | @ 15:00:00 | NDC 00904647461

## 2020-02-18 MED ADMIN — GABAPENTIN 300 MG PO CAP [18308]: 300 mg | ORAL | @ 12:00:00 | NDC 00904666661

## 2020-02-18 MED ADMIN — GABAPENTIN 300 MG PO CAP [18308]: 300 mg | ORAL | @ 21:00:00 | NDC 00904666661

## 2020-02-18 MED ADMIN — METHOCARBAMOL 500 MG PO TAB [4971]: 500 mg | ORAL | @ 04:00:00 | NDC 00904705761

## 2020-02-18 MED ADMIN — IPRATROPIUM-ALBUTEROL 0.5 MG-3 MG(2.5 MG BASE)/3 ML IN NEBU [77459]: 3 mL | RESPIRATORY_TRACT | @ 16:00:00 | NDC 00378967131

## 2020-02-18 MED ADMIN — LEVOTHYROXINE 75 MCG PO TAB [4422]: 75 ug | ORAL | @ 12:00:00 | NDC 51079044101

## 2020-02-18 MED ADMIN — MELATONIN 5 MG PO TAB [168576]: 5 mg | ORAL | @ 04:00:00 | NDC 77333052025

## 2020-02-18 MED ADMIN — ASPIRIN 81 MG PO TBEC [14113]: 81 mg | ORAL | @ 04:00:00 | NDC 00536123441

## 2020-02-18 MED ADMIN — ACETAMINOPHEN 500 MG PO TAB [102]: 1000 mg | ORAL | @ 12:00:00 | NDC 00904673061

## 2020-02-18 MED ADMIN — ENOXAPARIN 30 MG/0.3 ML SC SYRG [85048]: 30 mg | SUBCUTANEOUS | @ 04:00:00 | NDC 60505079100

## 2020-02-18 MED ADMIN — ACETAMINOPHEN 500 MG PO TAB [102]: 1000 mg | ORAL | @ 18:00:00 | NDC 00904673061

## 2020-02-18 MED ADMIN — GABAPENTIN 300 MG PO CAP [18308]: 300 mg | ORAL | @ 04:00:00 | NDC 00904666661

## 2020-02-18 MED ADMIN — TAMSULOSIN 0.4 MG PO CAP [80077]: 0.4 mg | ORAL | @ 15:00:00 | NDC 00904640161

## 2020-02-18 MED ADMIN — IPRATROPIUM-ALBUTEROL 0.5 MG-3 MG(2.5 MG BASE)/3 ML IN NEBU [77459]: 3 mL | RESPIRATORY_TRACT | @ 02:00:00 | NDC 00378967131

## 2020-02-19 ENCOUNTER — Inpatient Hospital Stay: Admit: 2020-02-19 | Discharge: 2020-02-19 | Payer: MEDICARE

## 2020-02-19 MED ADMIN — GABAPENTIN 300 MG PO CAP [18308]: 300 mg | ORAL | @ 19:00:00 | NDC 00904666661

## 2020-02-19 MED ADMIN — ACETAMINOPHEN 500 MG PO TAB [102]: 1000 mg | ORAL | @ 17:00:00 | NDC 00904673061

## 2020-02-19 MED ADMIN — CEFTRIAXONE INJ 1GM IVP [210253]: 1 g | INTRAVENOUS | Stop: 2020-02-22 | NDC 00409733211

## 2020-02-19 MED ADMIN — GABAPENTIN 300 MG PO CAP [18308]: 300 mg | ORAL | @ 12:00:00 | NDC 00904666661

## 2020-02-19 MED ADMIN — ACETAMINOPHEN 500 MG PO TAB [102]: 1000 mg | ORAL | @ 12:00:00 | NDC 00904673061

## 2020-02-19 MED ADMIN — ASPIRIN 81 MG PO TBEC [14113]: 81 mg | ORAL | @ 04:00:00 | NDC 63739021202

## 2020-02-19 MED ADMIN — OXYCODONE 5 MG PO TAB [10814]: 5 mg | ORAL | @ 14:00:00 | NDC 00406055223

## 2020-02-19 MED ADMIN — TAMSULOSIN 0.4 MG PO CAP [80077]: 0.4 mg | ORAL | @ 14:00:00 | NDC 00904640161

## 2020-02-19 MED ADMIN — METHOCARBAMOL 500 MG PO TAB [4971]: 500 mg | ORAL | @ 04:00:00 | NDC 00904705761

## 2020-02-19 MED ADMIN — ENOXAPARIN 30 MG/0.3 ML SC SYRG [85048]: 30 mg | SUBCUTANEOUS | @ 14:00:00 | NDC 60505079100

## 2020-02-19 MED ADMIN — ENOXAPARIN 30 MG/0.3 ML SC SYRG [85048]: 30 mg | SUBCUTANEOUS | @ 04:00:00 | NDC 60505079100

## 2020-02-19 MED ADMIN — OXYCODONE 5 MG PO TAB [10814]: 5 mg | ORAL | @ 04:00:00 | NDC 00406055223

## 2020-02-19 MED ADMIN — IPRATROPIUM-ALBUTEROL 0.5 MG-3 MG(2.5 MG BASE)/3 ML IN NEBU [77459]: 3 mL | RESPIRATORY_TRACT | @ 02:00:00 | NDC 00378967131

## 2020-02-19 MED ADMIN — OXYCODONE 5 MG PO TAB [10814]: 5 mg | ORAL | @ 21:00:00 | NDC 00406055223

## 2020-02-19 MED ADMIN — PANTOPRAZOLE 40 MG PO TBEC [80436]: 40 mg | ORAL | @ 14:00:00 | NDC 00904647461

## 2020-02-19 MED ADMIN — MELATONIN 5 MG PO TAB [168576]: 5 mg | ORAL | @ 04:00:00 | NDC 77333052025

## 2020-02-19 MED ADMIN — IPRATROPIUM-ALBUTEROL 0.5 MG-3 MG(2.5 MG BASE)/3 ML IN NEBU [77459]: 3 mL | RESPIRATORY_TRACT | @ 19:00:00 | NDC 00378967131

## 2020-02-19 MED ADMIN — ACETAMINOPHEN 500 MG PO TAB [102]: 1000 mg | ORAL | NDC 00904673061

## 2020-02-19 MED ADMIN — OXYCODONE 5 MG PO TAB [10814]: 5 mg | ORAL | @ 19:00:00 | NDC 00406055223

## 2020-02-19 MED ADMIN — GABAPENTIN 300 MG PO CAP [18308]: 300 mg | ORAL | @ 04:00:00 | NDC 00904666661

## 2020-02-19 MED ADMIN — IPRATROPIUM-ALBUTEROL 0.5 MG-3 MG(2.5 MG BASE)/3 ML IN NEBU [77459]: 3 mL | RESPIRATORY_TRACT | @ 15:00:00 | NDC 00378967131

## 2020-02-19 MED ADMIN — LIDOCAINE 5 % TP PTMD [80759]: 2 | TOPICAL | @ 14:00:00 | NDC 00591352530

## 2020-02-19 MED ADMIN — LEVOTHYROXINE 50 MCG PO TAB [4421]: 50 ug | ORAL | @ 12:00:00 | NDC 51079044001

## 2020-02-19 MED ADMIN — METHOCARBAMOL 500 MG PO TAB [4971]: 500 mg | ORAL | @ 20:00:00 | NDC 00904705761

## 2020-02-19 MED ADMIN — METHOCARBAMOL 500 MG PO TAB [4971]: 500 mg | ORAL | @ 14:00:00 | NDC 00904705761

## 2020-02-20 MED ADMIN — IPRATROPIUM-ALBUTEROL 0.5 MG-3 MG(2.5 MG BASE)/3 ML IN NEBU [77459]: 3 mL | RESPIRATORY_TRACT | @ 19:00:00 | Stop: 2020-02-20 | NDC 00378967131

## 2020-02-20 MED ADMIN — METHOCARBAMOL 500 MG PO TAB [4971]: 500 mg | ORAL | @ 15:00:00 | Stop: 2020-02-20 | NDC 00904705761

## 2020-02-20 MED ADMIN — ACETAMINOPHEN 500 MG PO TAB [102]: 1000 mg | ORAL | @ 08:00:00 | Stop: 2020-02-20 | NDC 00904673061

## 2020-02-20 MED ADMIN — ENOXAPARIN 30 MG/0.3 ML SC SYRG [85048]: 30 mg | SUBCUTANEOUS | @ 15:00:00 | Stop: 2020-02-20 | NDC 71288041080

## 2020-02-20 MED ADMIN — ACETAMINOPHEN 500 MG PO TAB [102]: 1000 mg | ORAL | @ 19:00:00 | Stop: 2020-02-20 | NDC 00904673061

## 2020-02-20 MED ADMIN — IPRATROPIUM-ALBUTEROL 0.5 MG-3 MG(2.5 MG BASE)/3 ML IN NEBU [77459]: 3 mL | RESPIRATORY_TRACT | @ 16:00:00 | Stop: 2020-02-20 | NDC 00378967131

## 2020-02-20 MED ADMIN — GABAPENTIN 300 MG PO CAP [18308]: 300 mg | ORAL | @ 12:00:00 | Stop: 2020-02-20 | NDC 00904666661

## 2020-02-20 MED ADMIN — IPRATROPIUM-ALBUTEROL 0.5 MG-3 MG(2.5 MG BASE)/3 ML IN NEBU [77459]: 3 mL | RESPIRATORY_TRACT | @ 03:00:00 | NDC 00378967131

## 2020-02-20 MED ADMIN — LEVOTHYROXINE 75 MCG PO TAB [4422]: 75 ug | ORAL | @ 12:00:00 | Stop: 2020-02-20 | NDC 51079044101

## 2020-02-20 MED ADMIN — ASPIRIN 81 MG PO TBEC [14113]: 81 mg | ORAL | @ 03:00:00 | NDC 00536123441

## 2020-02-20 MED ADMIN — OXYCODONE 5 MG PO TAB [10814]: 5 mg | ORAL | @ 14:00:00 | Stop: 2020-02-20 | NDC 00406055223

## 2020-02-20 MED ADMIN — ACETAMINOPHEN 500 MG PO TAB [102]: 1000 mg | ORAL | @ 15:00:00 | Stop: 2020-02-20 | NDC 00904673061

## 2020-02-20 MED ADMIN — METHOCARBAMOL 500 MG PO TAB [4971]: 500 mg | ORAL | @ 03:00:00 | NDC 00904705761

## 2020-02-20 MED ADMIN — ENOXAPARIN 30 MG/0.3 ML SC SYRG [85048]: 30 mg | SUBCUTANEOUS | @ 03:00:00 | NDC 71288041080

## 2020-02-20 MED ADMIN — MELATONIN 5 MG PO TAB [168576]: 5 mg | ORAL | @ 03:00:00 | NDC 77333052025

## 2020-02-20 MED ADMIN — OXYCODONE 5 MG PO TAB [10814]: 5 mg | ORAL | @ 13:00:00 | Stop: 2020-02-20 | NDC 00406055223

## 2020-02-20 MED ADMIN — TAMSULOSIN 0.4 MG PO CAP [80077]: 0.4 mg | ORAL | @ 15:00:00 | Stop: 2020-02-20 | NDC 00904640161

## 2020-02-20 MED ADMIN — GABAPENTIN 300 MG PO CAP [18308]: 300 mg | ORAL | @ 19:00:00 | Stop: 2020-02-20 | NDC 00904666661

## 2020-02-20 MED ADMIN — CEFTRIAXONE INJ 1GM IVP [210253]: 1 g | INTRAVENOUS | @ 21:00:00 | Stop: 2020-02-20 | NDC 00409733211

## 2020-02-20 MED ADMIN — METHOCARBAMOL 500 MG PO TAB [4971]: 500 mg | ORAL | @ 21:00:00 | Stop: 2020-02-20 | NDC 00904705761

## 2020-03-05 ENCOUNTER — Encounter: Admit: 2020-03-05 | Discharge: 2020-03-05 | Payer: MEDICARE

## 2020-03-14 ENCOUNTER — Encounter: Admit: 2020-03-14 | Discharge: 2020-03-14 | Payer: MEDICARE
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
## 2020-05-15 ENCOUNTER — Encounter: Admit: 2020-05-15 | Discharge: 2020-05-15 | Payer: MEDICARE

## 2020-08-14 ENCOUNTER — Encounter: Admit: 2020-08-14 | Discharge: 2020-08-14 | Payer: MEDICARE

## 2020-08-19 ENCOUNTER — Encounter: Admit: 2020-08-19 | Discharge: 2020-08-19 | Payer: MEDICARE

## 2020-08-19 DIAGNOSIS — E785 Hyperlipidemia, unspecified: Principal | ICD-10-CM

## 2020-08-19 DIAGNOSIS — S7291XA Unspecified fracture of right femur, initial encounter for closed fracture: Secondary | ICD-10-CM

## 2020-08-19 DIAGNOSIS — E039 Hypothyroidism, unspecified: Secondary | ICD-10-CM

## 2020-08-19 DIAGNOSIS — J449 Chronic obstructive pulmonary disease, unspecified: Secondary | ICD-10-CM

## 2020-08-19 DIAGNOSIS — K319 Disease of stomach and duodenum, unspecified: Secondary | ICD-10-CM

## 2020-08-19 DIAGNOSIS — I519 Heart disease, unspecified: Secondary | ICD-10-CM

## 2020-08-19 DIAGNOSIS — I10 Essential (primary) hypertension: Secondary | ICD-10-CM

## 2020-08-19 DIAGNOSIS — M810 Age-related osteoporosis without current pathological fracture: Secondary | ICD-10-CM

## 2020-08-19 DIAGNOSIS — N289 Disorder of kidney and ureter, unspecified: Secondary | ICD-10-CM

## 2020-08-19 DIAGNOSIS — M1990 Unspecified osteoarthritis, unspecified site: Secondary | ICD-10-CM

## 2020-08-19 DIAGNOSIS — N183 CKD (chronic kidney disease) stage 3, GFR 30-59 ml/min (HCC): Secondary | ICD-10-CM

## 2020-08-26 ENCOUNTER — Encounter: Admit: 2020-08-26 | Discharge: 2020-08-26 | Payer: MEDICARE

## 2020-08-26 DIAGNOSIS — I10 Essential (primary) hypertension: Secondary | ICD-10-CM

## 2020-08-26 DIAGNOSIS — J449 Chronic obstructive pulmonary disease, unspecified: Secondary | ICD-10-CM

## 2020-08-26 DIAGNOSIS — K319 Disease of stomach and duodenum, unspecified: Secondary | ICD-10-CM

## 2020-08-26 DIAGNOSIS — N289 Disorder of kidney and ureter, unspecified: Secondary | ICD-10-CM

## 2020-08-26 DIAGNOSIS — E7800 Pure hypercholesterolemia, unspecified: Secondary | ICD-10-CM

## 2020-08-26 DIAGNOSIS — S7291XA Unspecified fracture of right femur, initial encounter for closed fracture: Secondary | ICD-10-CM

## 2020-08-26 DIAGNOSIS — N183 CKD (chronic kidney disease) stage 3, GFR 30-59 ml/min (HCC): Secondary | ICD-10-CM

## 2020-08-26 DIAGNOSIS — M1990 Unspecified osteoarthritis, unspecified site: Secondary | ICD-10-CM

## 2020-08-26 DIAGNOSIS — M810 Age-related osteoporosis without current pathological fracture: Secondary | ICD-10-CM

## 2020-08-26 DIAGNOSIS — I519 Heart disease, unspecified: Secondary | ICD-10-CM

## 2020-08-26 DIAGNOSIS — I25110 Atherosclerotic heart disease of native coronary artery with unstable angina pectoris: Secondary | ICD-10-CM

## 2020-08-26 DIAGNOSIS — E039 Hypothyroidism, unspecified: Secondary | ICD-10-CM

## 2020-08-26 DIAGNOSIS — E785 Hyperlipidemia, unspecified: Principal | ICD-10-CM

## 2020-08-26 MED ORDER — EZETIMIBE 10 MG PO TAB
10 mg | ORAL_TABLET | Freq: Every day | ORAL | 1 refills | Status: AC
Start: 2020-08-26 — End: ?

## 2020-08-26 NOTE — Patient Instructions
Stop Plavix  Start Zetia 10mg   Lipid panel in 3 months    Follow up as directed.  Call sooner if issues.  Call the [facility] nursing line at [phone].  Leave a detailed message for the nurse in [place] [facility] with how we can assist you and we will call you back.

## 2020-11-28 ENCOUNTER — Encounter: Admit: 2020-11-28 | Discharge: 2020-11-28 | Payer: MEDICARE

## 2020-11-28 NOTE — Telephone Encounter
Pt called and states that she has stopped taking the zetia.  She states that while on it, she had terrible muscle aches and cramps and had times where she couldn't move for 15 minutes.  Since stopping the medication she is feeling better.  She is more active and eating healthier.  She will have her lipids rechecked in November as ordered by Dr. Sandria Manly.  Updated med list and allergy list.

## 2020-12-08 ENCOUNTER — Encounter: Admit: 2020-12-08 | Discharge: 2020-12-08 | Payer: MEDICARE

## 2020-12-08 DIAGNOSIS — I25110 Atherosclerotic heart disease of native coronary artery with unstable angina pectoris: Secondary | ICD-10-CM

## 2020-12-08 DIAGNOSIS — I10 Essential (primary) hypertension: Secondary | ICD-10-CM

## 2020-12-08 DIAGNOSIS — E7800 Pure hypercholesterolemia, unspecified: Secondary | ICD-10-CM

## 2020-12-08 MED ORDER — ATORVASTATIN 80 MG PO TAB
80 mg | ORAL_TABLET | Freq: Every day | ORAL | 3 refills | Status: AC
Start: 2020-12-08 — End: ?

## 2020-12-08 MED ORDER — ASPIRIN 81 MG PO TBEC
81 mg | ORAL_TABLET | Freq: Every day | ORAL | 3 refills | Status: AC
Start: 2020-12-08 — End: ?

## 2021-08-20 ENCOUNTER — Encounter: Admit: 2021-08-20 | Discharge: 2021-08-20 | Payer: MEDICARE

## 2021-08-24 ENCOUNTER — Encounter: Admit: 2021-08-24 | Discharge: 2021-08-24 | Payer: MEDICARE

## 2021-08-24 MED ORDER — ATORVASTATIN 80 MG PO TAB
80 mg | ORAL_TABLET | Freq: Every day | ORAL | 2 refills | Status: AC
Start: 2021-08-24 — End: ?

## 2021-08-24 MED ORDER — ATORVASTATIN 80 MG PO TAB
80 mg | ORAL_TABLET | Freq: Every day | ORAL | 3 refills | Status: DC
Start: 2021-08-24 — End: 2021-08-24

## 2021-09-28 ENCOUNTER — Encounter: Admit: 2021-09-28 | Discharge: 2021-09-28 | Payer: MEDICARE

## 2021-10-07 ENCOUNTER — Encounter: Admit: 2021-10-07 | Discharge: 2021-10-07 | Payer: MEDICARE

## 2021-10-13 ENCOUNTER — Encounter: Admit: 2021-10-13 | Discharge: 2021-10-13 | Payer: MEDICARE

## 2021-10-13 DIAGNOSIS — E7800 Pure hypercholesterolemia, unspecified: Secondary | ICD-10-CM

## 2021-10-13 DIAGNOSIS — N183 CKD (chronic kidney disease) stage 3, GFR 30-59 ml/min (HCC): Secondary | ICD-10-CM

## 2021-10-13 DIAGNOSIS — M1990 Unspecified osteoarthritis, unspecified site: Secondary | ICD-10-CM

## 2021-10-13 DIAGNOSIS — I519 Heart disease, unspecified: Secondary | ICD-10-CM

## 2021-10-13 DIAGNOSIS — Z136 Encounter for screening for cardiovascular disorders: Secondary | ICD-10-CM

## 2021-10-13 DIAGNOSIS — S7291XA Unspecified fracture of right femur, initial encounter for closed fracture: Secondary | ICD-10-CM

## 2021-10-13 DIAGNOSIS — J449 Chronic obstructive pulmonary disease, unspecified: Secondary | ICD-10-CM

## 2021-10-13 DIAGNOSIS — K319 Disease of stomach and duodenum, unspecified: Secondary | ICD-10-CM

## 2021-10-13 DIAGNOSIS — I10 Essential (primary) hypertension: Secondary | ICD-10-CM

## 2021-10-13 DIAGNOSIS — E039 Hypothyroidism, unspecified: Secondary | ICD-10-CM

## 2021-10-13 DIAGNOSIS — N289 Disorder of kidney and ureter, unspecified: Secondary | ICD-10-CM

## 2021-10-13 DIAGNOSIS — M810 Age-related osteoporosis without current pathological fracture: Secondary | ICD-10-CM

## 2021-10-13 DIAGNOSIS — I25110 Atherosclerotic heart disease of native coronary artery with unstable angina pectoris: Secondary | ICD-10-CM

## 2021-10-13 DIAGNOSIS — E785 Hyperlipidemia, unspecified: Secondary | ICD-10-CM

## 2021-11-08 ENCOUNTER — Encounter: Admit: 2021-11-08 | Discharge: 2021-11-08 | Payer: MEDICARE

## 2021-11-08 NOTE — Progress Notes
Lipid Management Review    LDL   Date Value Ref Range Status   06/22/2021 96  Final   12/05/2020 69     06/16/2020 91  Final     Calcium Score Date & Result: N/A  Current lipid lowering medications: atorvastatin  Previous lipid lowering medications tried/failed: zetia-leg cramping  Next OV: Due 09/2022. Pt declines PCSK9 inhitibor use    Action Items: Per recent OV note--" She plans to continue her statin and politely declined future discussions regarding her cholesterol."

## 2022-08-24 ENCOUNTER — Encounter: Admit: 2022-08-24 | Discharge: 2022-08-24 | Payer: MEDICARE

## 2022-08-24 MED ORDER — ATORVASTATIN 80 MG PO TAB
80 mg | ORAL_TABLET | Freq: Every day | ORAL | 0 refills | Status: AC
Start: 2022-08-24 — End: ?

## 2022-11-08 ENCOUNTER — Encounter: Admit: 2022-11-08 | Discharge: 2022-11-08 | Payer: MEDICARE

## 2022-11-09 ENCOUNTER — Encounter: Admit: 2022-11-09 | Discharge: 2022-11-09 | Payer: MEDICARE

## 2022-11-16 ENCOUNTER — Encounter: Admit: 2022-11-16 | Discharge: 2022-11-16 | Payer: MEDICARE

## 2022-11-16 DIAGNOSIS — N183 CKD (chronic kidney disease) stage 3, GFR 30-59 ml/min (HCC): Secondary | ICD-10-CM

## 2022-11-16 DIAGNOSIS — K319 Disease of stomach and duodenum, unspecified: Secondary | ICD-10-CM

## 2022-11-16 DIAGNOSIS — I519 Heart disease, unspecified: Secondary | ICD-10-CM

## 2022-11-16 DIAGNOSIS — M1990 Unspecified osteoarthritis, unspecified site: Secondary | ICD-10-CM

## 2022-11-16 DIAGNOSIS — S7291XA Unspecified fracture of right femur, initial encounter for closed fracture: Secondary | ICD-10-CM

## 2022-11-16 DIAGNOSIS — I10 Essential (primary) hypertension: Secondary | ICD-10-CM

## 2022-11-16 DIAGNOSIS — E785 Hyperlipidemia, unspecified: Secondary | ICD-10-CM

## 2022-11-16 DIAGNOSIS — E039 Hypothyroidism, unspecified: Secondary | ICD-10-CM

## 2022-11-16 DIAGNOSIS — N289 Disorder of kidney and ureter, unspecified: Secondary | ICD-10-CM

## 2022-11-16 DIAGNOSIS — Z136 Encounter for screening for cardiovascular disorders: Secondary | ICD-10-CM

## 2022-11-16 DIAGNOSIS — M810 Age-related osteoporosis without current pathological fracture: Secondary | ICD-10-CM

## 2022-11-16 DIAGNOSIS — J449 Chronic obstructive pulmonary disease, unspecified: Secondary | ICD-10-CM

## 2022-11-16 MED ORDER — LOSARTAN 50 MG PO TAB
50 mg | ORAL_TABLET | Freq: Every day | ORAL | 3 refills | 90.00000 days | Status: AC
Start: 2022-11-16 — End: ?
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
## 2023-01-02 ENCOUNTER — Encounter: Admit: 2023-01-02 | Discharge: 2023-01-02 | Payer: MEDICARE

## 2023-01-02 NOTE — Progress Notes
Lipid Management Review    LDL   Date Value Ref Range Status   10/25/2022 93  Final   06/22/2021 96  Final   12/05/2020 69       Calcium Score Date & Result: N/A  Current lipid lowering medications: atorvastatin  Previous lipid lowering medications tried/failed: zetia-leg cramping  Next OV: Due 11/2023. Pt declines PCSK9 inhitibor use    Action Items: None. Pt declines PCSK9 inhitibor use; intolerant to zetia.

## 2023-12-03 ENCOUNTER — Encounter: Admit: 2023-12-03 | Discharge: 2023-12-03 | Payer: MEDICARE

## 2023-12-07 ENCOUNTER — Encounter: Admit: 2023-12-07 | Discharge: 2023-12-07 | Payer: MEDICARE
